# Patient Record
Sex: Female | Born: 1960 | Race: White | Hispanic: No | Marital: Married | Smoking: Never smoker
Health system: Southern US, Community
[De-identification: ages and names within clinical notes are randomized; demographics above are authoritative.]

## PROBLEM LIST (undated history)

## (undated) DIAGNOSIS — Z91018 Allergy to other foods: Secondary | ICD-10-CM

## (undated) DIAGNOSIS — E079 Disorder of thyroid, unspecified: Secondary | ICD-10-CM

## (undated) DIAGNOSIS — F32A Depression, unspecified: Secondary | ICD-10-CM

## (undated) DIAGNOSIS — E039 Hypothyroidism, unspecified: Secondary | ICD-10-CM

## (undated) DIAGNOSIS — R112 Nausea with vomiting, unspecified: Secondary | ICD-10-CM

## (undated) DIAGNOSIS — J45909 Unspecified asthma, uncomplicated: Secondary | ICD-10-CM

## (undated) DIAGNOSIS — Z9889 Other specified postprocedural states: Secondary | ICD-10-CM

## (undated) DIAGNOSIS — F988 Other specified behavioral and emotional disorders with onset usually occurring in childhood and adolescence: Secondary | ICD-10-CM

## (undated) DIAGNOSIS — K8020 Calculus of gallbladder without cholecystitis without obstruction: Secondary | ICD-10-CM

## (undated) DIAGNOSIS — R5382 Chronic fatigue, unspecified: Secondary | ICD-10-CM

## (undated) DIAGNOSIS — F329 Major depressive disorder, single episode, unspecified: Secondary | ICD-10-CM

## (undated) DIAGNOSIS — C50919 Malignant neoplasm of unspecified site of unspecified female breast: Secondary | ICD-10-CM

## (undated) DIAGNOSIS — M1990 Unspecified osteoarthritis, unspecified site: Secondary | ICD-10-CM

## (undated) HISTORY — DX: Calculus of gallbladder without cholecystitis without obstruction: K80.20

## (undated) HISTORY — DX: Chronic fatigue, unspecified: R53.82

## (undated) HISTORY — DX: Malignant neoplasm of unspecified site of unspecified female breast: C50.919

## (undated) HISTORY — DX: Unspecified osteoarthritis, unspecified site: M19.90

## (undated) HISTORY — PX: REFRACTIVE SURGERY: SHX103

## (undated) HISTORY — PX: BREAST SURGERY: SHX581

## (undated) HISTORY — DX: Disorder of thyroid, unspecified: E07.9

## (undated) HISTORY — PX: EYE SURGERY: SHX253

---

## 2003-02-19 ENCOUNTER — Other Ambulatory Visit: Admission: RE | Admit: 2003-02-19 | Discharge: 2003-02-19 | Payer: Self-pay | Admitting: Obstetrics and Gynecology

## 2003-04-09 ENCOUNTER — Encounter: Admission: RE | Admit: 2003-04-09 | Discharge: 2003-04-09 | Payer: Self-pay | Admitting: Obstetrics and Gynecology

## 2004-02-20 ENCOUNTER — Other Ambulatory Visit: Admission: RE | Admit: 2004-02-20 | Discharge: 2004-02-20 | Payer: Self-pay | Admitting: Obstetrics and Gynecology

## 2004-04-23 ENCOUNTER — Encounter: Admission: RE | Admit: 2004-04-23 | Discharge: 2004-04-23 | Payer: Self-pay | Admitting: Obstetrics and Gynecology

## 2005-04-24 ENCOUNTER — Encounter: Admission: RE | Admit: 2005-04-24 | Discharge: 2005-04-24 | Payer: Self-pay | Admitting: Obstetrics and Gynecology

## 2005-05-15 ENCOUNTER — Encounter: Admission: RE | Admit: 2005-05-15 | Discharge: 2005-05-15 | Payer: Self-pay | Admitting: Family Medicine

## 2006-08-25 ENCOUNTER — Encounter: Admission: RE | Admit: 2006-08-25 | Discharge: 2006-08-25 | Payer: Self-pay | Admitting: Obstetrics and Gynecology

## 2007-03-23 ENCOUNTER — Encounter: Admission: RE | Admit: 2007-03-23 | Discharge: 2007-03-23 | Payer: Self-pay | Admitting: Obstetrics and Gynecology

## 2008-09-10 ENCOUNTER — Encounter: Admission: RE | Admit: 2008-09-10 | Discharge: 2008-09-10 | Payer: Self-pay | Admitting: Obstetrics and Gynecology

## 2010-06-26 ENCOUNTER — Other Ambulatory Visit: Payer: Self-pay | Admitting: Obstetrics and Gynecology

## 2010-06-26 DIAGNOSIS — R2230 Localized swelling, mass and lump, unspecified upper limb: Secondary | ICD-10-CM

## 2010-07-02 ENCOUNTER — Ambulatory Visit
Admission: RE | Admit: 2010-07-02 | Discharge: 2010-07-02 | Disposition: A | Discharge status: Home / Self Care | Payer: Managed Care, Other (non HMO) | Source: Ambulatory Visit | Attending: Obstetrics and Gynecology | Admitting: Obstetrics and Gynecology

## 2010-07-02 ENCOUNTER — Other Ambulatory Visit: Payer: Self-pay | Admitting: Obstetrics and Gynecology

## 2010-07-02 DIAGNOSIS — R2230 Localized swelling, mass and lump, unspecified upper limb: Secondary | ICD-10-CM

## 2010-12-02 ENCOUNTER — Other Ambulatory Visit: Payer: Self-pay | Admitting: Obstetrics and Gynecology

## 2010-12-02 DIAGNOSIS — N63 Unspecified lump in unspecified breast: Secondary | ICD-10-CM

## 2010-12-05 ENCOUNTER — Other Ambulatory Visit: Payer: Self-pay | Admitting: Obstetrics and Gynecology

## 2010-12-05 ENCOUNTER — Ambulatory Visit
Admission: RE | Admit: 2010-12-05 | Discharge: 2010-12-05 | Disposition: A | Discharge status: Home / Self Care | Payer: Commercial Indemnity | Source: Ambulatory Visit | Attending: Obstetrics and Gynecology | Admitting: Obstetrics and Gynecology

## 2010-12-05 DIAGNOSIS — N63 Unspecified lump in unspecified breast: Secondary | ICD-10-CM

## 2011-06-29 ENCOUNTER — Ambulatory Visit (INDEPENDENT_AMBULATORY_CARE_PROVIDER_SITE_OTHER): Payer: Commercial Indemnity | Admitting: Obstetrics and Gynecology

## 2011-06-29 ENCOUNTER — Other Ambulatory Visit: Payer: Self-pay | Admitting: Obstetrics and Gynecology

## 2011-06-29 DIAGNOSIS — N644 Mastodynia: Secondary | ICD-10-CM

## 2011-06-29 DIAGNOSIS — Z3040 Encounter for surveillance of contraceptives, unspecified: Secondary | ICD-10-CM

## 2011-06-29 DIAGNOSIS — Z01419 Encounter for gynecological examination (general) (routine) without abnormal findings: Secondary | ICD-10-CM

## 2011-07-06 ENCOUNTER — Ambulatory Visit
Admission: RE | Admit: 2011-07-06 | Discharge: 2011-07-06 | Disposition: A | Discharge status: Home / Self Care | Payer: Commercial Indemnity | Source: Ambulatory Visit | Attending: Obstetrics and Gynecology | Admitting: Obstetrics and Gynecology

## 2011-07-06 DIAGNOSIS — N644 Mastodynia: Secondary | ICD-10-CM

## 2012-06-30 ENCOUNTER — Ambulatory Visit: Payer: Commercial Indemnity | Admitting: Obstetrics and Gynecology

## 2013-06-19 ENCOUNTER — Other Ambulatory Visit: Payer: Self-pay

## 2013-06-19 DIAGNOSIS — Z1231 Encounter for screening mammogram for malignant neoplasm of breast: Secondary | ICD-10-CM

## 2013-07-12 ENCOUNTER — Ambulatory Visit
Admission: RE | Admit: 2013-07-12 | Discharge: 2013-07-12 | Disposition: A | Discharge status: Home / Self Care | Payer: Commercial Indemnity | Source: Ambulatory Visit

## 2013-07-12 DIAGNOSIS — Z1231 Encounter for screening mammogram for malignant neoplasm of breast: Secondary | ICD-10-CM

## 2013-07-13 ENCOUNTER — Other Ambulatory Visit: Payer: Self-pay | Admitting: Obstetrics and Gynecology

## 2013-07-13 DIAGNOSIS — R928 Other abnormal and inconclusive findings on diagnostic imaging of breast: Secondary | ICD-10-CM

## 2013-07-20 ENCOUNTER — Other Ambulatory Visit: Payer: Self-pay | Admitting: Obstetrics and Gynecology

## 2013-07-20 ENCOUNTER — Other Ambulatory Visit: Payer: Self-pay

## 2013-07-20 DIAGNOSIS — R928 Other abnormal and inconclusive findings on diagnostic imaging of breast: Secondary | ICD-10-CM

## 2013-07-26 ENCOUNTER — Ambulatory Visit
Admission: RE | Admit: 2013-07-26 | Discharge: 2013-07-26 | Disposition: A | Discharge status: Home / Self Care | Payer: Self-pay | Source: Ambulatory Visit | Attending: Obstetrics and Gynecology | Admitting: Obstetrics and Gynecology

## 2013-07-26 ENCOUNTER — Ambulatory Visit
Admission: RE | Admit: 2013-07-26 | Discharge: 2013-07-26 | Disposition: A | Discharge status: Home / Self Care | Payer: Commercial Indemnity | Source: Ambulatory Visit | Attending: Obstetrics and Gynecology | Admitting: Obstetrics and Gynecology

## 2013-07-26 DIAGNOSIS — R928 Other abnormal and inconclusive findings on diagnostic imaging of breast: Secondary | ICD-10-CM

## 2014-04-20 DIAGNOSIS — C50919 Malignant neoplasm of unspecified site of unspecified female breast: Secondary | ICD-10-CM

## 2014-04-20 HISTORY — DX: Malignant neoplasm of unspecified site of unspecified female breast: C50.919

## 2014-09-12 ENCOUNTER — Other Ambulatory Visit: Payer: Self-pay | Admitting: Obstetrics and Gynecology

## 2014-09-12 DIAGNOSIS — N63 Unspecified lump in unspecified breast: Secondary | ICD-10-CM

## 2014-09-20 ENCOUNTER — Other Ambulatory Visit: Payer: Commercial Indemnity

## 2014-09-24 ENCOUNTER — Other Ambulatory Visit: Payer: Self-pay | Admitting: Obstetrics and Gynecology

## 2014-09-24 ENCOUNTER — Ambulatory Visit
Admission: RE | Admit: 2014-09-24 | Discharge: 2014-09-24 | Disposition: A | Discharge status: Home / Self Care | Payer: Managed Care, Other (non HMO) | Source: Ambulatory Visit | Attending: Obstetrics and Gynecology | Admitting: Obstetrics and Gynecology

## 2014-09-24 ENCOUNTER — Other Ambulatory Visit: Payer: Commercial Indemnity

## 2014-09-24 DIAGNOSIS — N63 Unspecified lump in unspecified breast: Secondary | ICD-10-CM

## 2014-09-27 ENCOUNTER — Other Ambulatory Visit: Payer: Self-pay | Admitting: Obstetrics and Gynecology

## 2014-09-27 DIAGNOSIS — N63 Unspecified lump in unspecified breast: Secondary | ICD-10-CM

## 2014-09-28 ENCOUNTER — Other Ambulatory Visit: Payer: Self-pay | Admitting: Obstetrics and Gynecology

## 2014-09-28 ENCOUNTER — Ambulatory Visit
Admission: RE | Admit: 2014-09-28 | Discharge: 2014-09-28 | Disposition: A | Discharge status: Home / Self Care | Payer: Managed Care, Other (non HMO) | Source: Ambulatory Visit | Attending: Obstetrics and Gynecology | Admitting: Obstetrics and Gynecology

## 2014-09-28 DIAGNOSIS — N63 Unspecified lump in unspecified breast: Secondary | ICD-10-CM

## 2014-10-03 ENCOUNTER — Telehealth: Payer: Self-pay | Admitting: *Deleted

## 2014-10-03 NOTE — Telephone Encounter (Signed)
Confirmed [facility] for 10/10/14 at 1230.  Instructions and contact information given.

## 2014-10-09 ENCOUNTER — Other Ambulatory Visit: Payer: Self-pay | Admitting: *Deleted

## 2014-10-09 DIAGNOSIS — C50919 Malignant neoplasm of unspecified site of unspecified female breast: Secondary | ICD-10-CM

## 2014-10-09 DIAGNOSIS — C50411 Malignant neoplasm of upper-outer quadrant of right female breast: Secondary | ICD-10-CM | POA: Insufficient documentation

## 2014-10-10 ENCOUNTER — Encounter: Payer: Self-pay | Admitting: Hematology and Oncology

## 2014-10-10 ENCOUNTER — Ambulatory Visit: Payer: Self-pay | Admitting: Surgery

## 2014-10-10 ENCOUNTER — Other Ambulatory Visit (HOSPITAL_BASED_OUTPATIENT_CLINIC_OR_DEPARTMENT_OTHER): Payer: Managed Care, Other (non HMO)

## 2014-10-10 ENCOUNTER — Ambulatory Visit: Payer: Managed Care, Other (non HMO)

## 2014-10-10 ENCOUNTER — Encounter: Payer: Self-pay | Admitting: Physical Therapy

## 2014-10-10 ENCOUNTER — Ambulatory Visit
Admission: RE | Admit: 2014-10-10 | Discharge: 2014-10-10 | Disposition: A | Discharge status: Home / Self Care | Payer: Managed Care, Other (non HMO) | Source: Ambulatory Visit | Attending: Radiation Oncology | Admitting: Radiation Oncology

## 2014-10-10 ENCOUNTER — Ambulatory Visit: Payer: Managed Care, Other (non HMO) | Attending: Surgery | Admitting: Physical Therapy

## 2014-10-10 ENCOUNTER — Ambulatory Visit (HOSPITAL_BASED_OUTPATIENT_CLINIC_OR_DEPARTMENT_OTHER): Payer: Managed Care, Other (non HMO) | Admitting: Hematology and Oncology

## 2014-10-10 VITALS — BP 117/64 | HR 69 | Temp 98.2°F | Resp 18 | Ht 66.0 in | Wt 127.4 lb

## 2014-10-10 DIAGNOSIS — C50411 Malignant neoplasm of upper-outer quadrant of right female breast: Secondary | ICD-10-CM

## 2014-10-10 DIAGNOSIS — Z803 Family history of malignant neoplasm of breast: Secondary | ICD-10-CM

## 2014-10-10 DIAGNOSIS — C50919 Malignant neoplasm of unspecified site of unspecified female breast: Secondary | ICD-10-CM

## 2014-10-10 LAB — CBC WITH DIFFERENTIAL/PLATELET
BASO%: 0.4 % (ref 0.0–2.0)
Basophils Absolute: 0 10*3/uL (ref 0.0–0.1)
EOS%: 1.3 % (ref 0.0–7.0)
Eosinophils Absolute: 0.1 10*3/uL (ref 0.0–0.5)
HEMATOCRIT: 40.2 % (ref 34.8–46.6)
HGB: 13.5 g/dL (ref 11.6–15.9)
LYMPH#: 1.9 10*3/uL (ref 0.9–3.3)
LYMPH%: 25.4 % (ref 14.0–49.7)
MCH: 28.6 pg (ref 25.1–34.0)
MCHC: 33.7 g/dL (ref 31.5–36.0)
MCV: 84.9 fL (ref 79.5–101.0)
MONO#: 0.6 10*3/uL (ref 0.1–0.9)
MONO%: 7.8 % (ref 0.0–14.0)
NEUT#: 4.7 10*3/uL (ref 1.5–6.5)
NEUT%: 65.1 % (ref 38.4–76.8)
Platelets: 213 10*3/uL (ref 145–400)
RBC: 4.73 10*6/uL (ref 3.70–5.45)
RDW: 13.2 % (ref 11.2–14.5)
WBC: 7.3 10*3/uL (ref 3.9–10.3)

## 2014-10-10 LAB — COMPREHENSIVE METABOLIC PANEL (CC13)
ALBUMIN: 4.5 g/dL (ref 3.5–5.0)
ALT: 9 U/L (ref 0–55)
ANION GAP: 8 meq/L (ref 3–11)
AST: 18 U/L (ref 5–34)
Alkaline Phosphatase: 71 U/L (ref 40–150)
BUN: 14.7 mg/dL (ref 7.0–26.0)
CALCIUM: 10.1 mg/dL (ref 8.4–10.4)
CHLORIDE: 104 meq/L (ref 98–109)
CO2: 31 meq/L — AB (ref 22–29)
Creatinine: 1 mg/dL (ref 0.6–1.1)
EGFR: 65 mL/min/{1.73_m2} — ABNORMAL LOW (ref >90)
Glucose: 107 mg/dl (ref 70–140)
POTASSIUM: 4 meq/L (ref 3.5–5.1)
Sodium: 143 mEq/L (ref 136–145)
Total Bilirubin: 0.43 mg/dL (ref 0.20–1.20)
Total Protein: 7 g/dL (ref 6.4–8.3)

## 2014-10-10 NOTE — Progress Notes (Signed)
[name] is a very pleasant 54 y.o. 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  Biopsy results revealed the tumor's prognostic profile is ER positive, PR positive, and HER2/neu negative. Ki67 is 5%.  She presents today with her husband to the [facility]) for treatment consideration and recommendations from the breast surgeon, radiation oncologist, and medical oncologist.     I briefly met with [name] and her husband during her [facility] visit today. We discussed the purpose of the Survivorship Clinic, which will include monitoring for recurrence, coordinating completion of age and gender-appropriate cancer screenings, promotion of overall wellness, as well as managing potential late/long-term side effects of anti-cancer treatments.    The treatment plan for [name] will likely include surgery, potential radiation therapy depending on her surgery choice, and anti-estrogen therapy.  As of today, the intent of treatment for [name] is cure, therefore she will be eligible for the Survivorship Clinic upon her completion of treatment.  Her survivorship care plan (SCP) document will be drafted and updated throughout the course of her treatment trajectory. She will receive the SCP in an office visit with myself in the Survivorship Clinic once she has completed treatment.   [name] was encouraged to ask questions and all questions were answered to her satisfaction.  She was given my business card and encouraged to contact me with any concerns regarding survivorship.  I look forward to participating in her care.   Mike Craze, NP [place] [phone]

## 2014-10-10 NOTE — Patient Instructions (Signed)

## 2014-10-10 NOTE — Progress Notes (Signed)
Checked in new pt with no financial concerns prior to seeing the dr. Informed her if chemo is part of her treatment we will call her ins to see if Josem Kaufmann is required and will obtain it if it is. She has my card for any billing questions, concerns or if financial assistance is needed.

## 2014-10-10 NOTE — Therapy (Signed)
[place], [place], [address] Phone: [phone]   Fax:  [phone]  Physical Therapy Evaluation  Patient Details  Name: Tracey Mckinney MRN: [id number] Date of Birth: 1960/08/29 Referring Provider:  Erroll Luna, MD  Encounter Date: 10/10/2014      PT End of Session - 10/10/14 2144    Visit Number 1   Number of Visits 1   PT Start Time 9833   PT Stop Time 1425  And from 1530-1550 (30 minutes total)   PT Time Calculation (min) 10 min   Activity Tolerance Patient tolerated treatment well   Behavior During Therapy [facility] for tasks assessed/performed      Past Medical History  Diagnosis Date  . Chronic fatigue   . Thyroid disease   . Gallstones   . Arthritis     Past Surgical History  Procedure Laterality Date  . Refractive surgery      There were no vitals filed for this visit.  Visit Diagnosis:  Carcinoma of upper-outer quadrant of right female breast - Plan: PT plan of care cert/re-cert      Subjective Assessment - 10/10/14 2145    Pertinent History Patient was diagnosed 10/03/14 with right ER/PR positive, HER2 negative invasive ductal carcinoma with DCIS.  It has a ki67 of 5% and measures 1.8 cm in the right upper outer quadrant.            [facility] PT Assessment - 10/10/14 0001    Assessment   Medical Diagnosis Right breast cancer   Onset Date/Surgical Date 10/03/14   Hand Dominance Right   Precautions   Precautions Other (comment)  Active breast cancer   Restrictions   Weight Bearing Restrictions No   Balance Screen   Has the patient fallen in the past 6 months No   Has the patient had a decrease in activity level because of a fear of falling?  No   Is the patient reluctant to leave their home because of a fear of falling?  No   Home Environment   Living Environment Private residence   Living Arrangements Spouse/significant other;Children  Husband and 70 y.o. 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   Leisure She walks 2x/week for 30-45 minutes   Cognition   Overall Cognitive Status Within Functional Limits for tasks assessed   Posture/Postural Control   Posture/Postural Control No significant limitations   ROM / Strength   AROM / PROM / Strength AROM;Strength   AROM   AROM Assessment Site Shoulder   Right/Left Shoulder Right;Left   Right Shoulder Extension 45 Degrees   Right Shoulder Flexion 140 Degrees   Right Shoulder ABduction 146 Degrees   Right Shoulder Internal Rotation 65 Degrees   Right Shoulder External Rotation 72 Degrees   Left Shoulder Extension 50 Degrees   Left Shoulder Flexion 144 Degrees   Left Shoulder ABduction 168 Degrees   Left Shoulder Internal Rotation 70 Degrees   Left Shoulder External Rotation 85 Degrees   Strength   Overall Strength Within functional limits for tasks performed           LYMPHEDEMA/ONCOLOGY QUESTIONNAIRE - 10/10/14 2142    Type   Cancer Type Right breast cancer   Lymphedema Assessments   Lymphedema Assessments Upper extremities   Right Upper Extremity Lymphedema   10 cm  Proximal to Olecranon Process 24.2 cm   Olecranon Process 22.1 cm   10 cm Proximal to Ulnar Styloid Process 18.7 cm   Just Proximal to Ulnar Styloid Process 14.2 cm   Across Hand at [employer] 17.9 cm   At [place] of 2nd Digit 5.3 cm   Left Upper Extremity Lymphedema   10 cm Proximal to Olecranon Process 23.5 cm   Olecranon Process 22.1 cm   10 cm Proximal to Ulnar Styloid Process 16.6 cm   Just Proximal to Ulnar Styloid Process 14 cm   Across Hand at [employer] 17.1 cm   At [place] of 2nd Digit 5.3 cm        Patient was instructed today in a home exercise program today for post op shoulder range of motion. These included active assist shoulder flexion  in sitting, scapular retraction, wall walking with shoulder abduction, and hands behind head external rotation.  She was encouraged to do these twice a day, holding 3 seconds and repeating 5 times when permitted by her physician.         PT Education - 10/10/14 2143    Education provided Yes   Education Details Post op shoulder ROM HEP and lymphedema risk reduction   Person(s) Educated Patient   Methods Explanation;Demonstration;Handout   Comprehension Returned demonstration;Verbalized understanding              Breast Clinic Goals - 10/10/14 2147    Patient will be able to verbalize understanding of pertinent lymphedema risk reduction practices relevant to her diagnosis specifically related to skin care.   Time 1   Period Days   Status Achieved   Patient will be able to return demonstrate and/or verbalize understanding of the post-op home exercise program related to regaining shoulder range of motion.   Time 1   Period Days   Status Achieved   Patient will be able to verbalize understanding of the importance of attending the postoperative After Breast Cancer Class for further lymphedema risk reduction education and therapeutic exercise.   Time 1   Period Days   Status Achieved              Plan - 10/10/14 2145    Clinical Impression Statement Patient was diagnosed 10/03/14 with right ER/PR positive, HER2 negative invasive ductal carcinoma with DCIS.  It has a ki67 of 5% and measures 1.8 cm in the right upper outer quadrant.  She is planning to havve either a right lumpectomy or mastectomy with a sentinel node biopsy followed by radiation if she has a lumpectomy.  She will undergo anti-estrogen therapy.  She will likely benefit from post op PT to regain shoulder ROM and strength and reduce lymphedema risk.   Pt will benefit from skilled therapeutic intervention in order to improve on the following deficits Decreased range of motion;Impaired UE functional  use;Pain;Decreased strength;Decreased knowledge of precautions   Rehab Potential Excellent   Clinical Impairments Affecting Rehab Potential none   PT Frequency One time visit   PT Treatment/Interventions Patient/family education;Therapeutic exercise   Consulted and Agree with Plan of Care Patient;Family member/caregiver   Family Member Consulted Husband     Patient will follow up at outpatient cancer rehab if needed following surgery.  If the patient requires physical therapy at that time, a specific plan will be dictated and sent to the referring physician for approval. The patient was educated today on appropriate basic range of motion exercises to begin post operatively and the importance  of attending the After Breast Cancer class following surgery.  Patient was educated today on lymphedema risk reduction practices as it pertains to recommendations that will benefit the patient immediately following surgery.  She verbalized good understanding.  No additional physical therapy is indicated at this time.       Problem List Patient Active Problem List   Diagnosis Date Noted  . Breast cancer of upper-outer quadrant of right female breast 10/09/2014    Annia Friendly, PT 10/10/2014, 9:49 PM  [place], [place], [address] Phone: [phone]   Fax:  [phone]

## 2014-10-10 NOTE — Progress Notes (Signed)
[place] NOTE  Patient Care Team: Arnetha Gula, MD as PCP - General (Family Medicine) Erroll Luna, MD as Consulting Physician (General Surgery) Nicholas Lose, MD as Consulting Physician (Hematology and Oncology) Gery Pray, MD as Consulting Physician (Radiation Oncology) Mauro Kaufmann, RN as Registered Nurse Rockwell Germany, RN as Registered Nurse  CHIEF COMPLAINTS/PURPOSE OF CONSULTATION:  Newly diagnosed right breast cancer  HISTORY OF PRESENTING ILLNESS:  Tracey Mckinney 54 y.o. female is here because of recent diagnosis of right breast cancer. Patient had long-standing problems with both breasts having cysts. She had cyst aspiration 2010 9 2012. Most recent mammogram in June 2016 revealed increased right breast density at 10:00 by ultrasound measured 1.8 cm in addition small cluster of additional nodules were also seen. Biopsy of the 10:00 mass revealed grade 1 invasive ductal carcinoma with DCIS that was ER/PR positive HER-2 negative with a Ki-67 of 5%, biopsy of the additional small nodule revealed benign fibrocystic changes. She was present this morning at the multidisciplinary tumor board and she is here today to discuss the treatment plan.   I reviewed her records extensively and collaborated the history with the patient.  SUMMARY OF ONCOLOGIC HISTORY:   Breast cancer of upper-outer quadrant of right female breast   09/28/2014 Initial Diagnosis Right breast needle biopsy 10:00 position: 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%, PR 50%, Ki-67 5%, HER-2 negative, grade 1; right breast biopsy 10:00 2 cm from nipple: Fibrocystic changes with usual ductal hyperplasia   09/28/2014 Mammogram Mammogram and ultrasound suspicious irregular mass at 10:00 position right breast 1.8 cm in addition 3 small nodules 0.5, 0.6, 0.5 cm biopsy proven to be benign fibrocystic changes    In terms of breast cancer risk profile:  She menarched at early age of 64 and went to  menopause at age 25  She had 2 pregnancy, her first child was born at age 33  She has received birth control pills for approximately 2 years, progesterone approximately 6 years testosterone for a year in 2014.  She was never exposed to fertility medications or hormone replacement therapy.  She has  family history of Breast/GYN/GI cancer Mother age 2 breast cancer, maternal grandfather had unknown cancer  MEDICAL HISTORY:  Past Medical History  Diagnosis Date  . Chronic fatigue   . Thyroid disease   . Gallstones   . Arthritis     SURGICAL HISTORY: Past Surgical History  Procedure Laterality Date  . Refractive surgery      SOCIAL HISTORY: History   Social History  . Marital Status: Married    Spouse Name: N/A  . Number of Children: N/A  . Years of Education: N/A   Occupational History  . Not on file.   Social History Main Topics  . Smoking status: Never Smoker   . Smokeless tobacco: Not on file  . Alcohol Use: No  . Drug Use: No  . Sexual Activity: Not on file   Other Topics Concern  . Not on file   Social History Narrative  . No narrative on file    FAMILY HISTORY: Family History  Problem Relation Age of Onset  . Breast cancer Mother     ALLERGIES:  has No Known Allergies.  MEDICATIONS:  Current Outpatient Prescriptions  Medication Sig Dispense Refill  . Cholecalciferol (VITAMIN D3) 5000 UNITS CAPS Take by mouth.    Marland Kitchen glucosamine-chondroitin 500-400 MG tablet Take 1 tablet by mouth.    . levalbuterol (XOPENEX HFA) 45  MCG/ACT inhaler Inhale into the lungs every 4 (four) hours as needed for wheezing.    . metroNIDAZOLE (METROCREAM) 0.75 % cream Apply topically 2 (two) times daily.    . Probiotic Product (ACIDOPHILUS/GOAT MILK) CAPS Take by mouth.    Marland Kitchen UNABLE TO FIND Thyroid 1/4 grain     No current facility-administered medications for this visit.    REVIEW OF SYSTEMS:   Constitutional: Denies fevers, chills or abnormal night sweats Eyes: Denies  blurriness of vision, double vision or watery eyes Ears, nose, mouth, throat, and face: Denies mucositis or sore throat Respiratory: Denies cough, dyspnea or wheezes Cardiovascular: Denies palpitation, chest discomfort or lower extremity swelling Gastrointestinal:  Denies nausea, heartburn or change in bowel habits Skin: Denies abnormal skin rashes Lymphatics: Denies new lymphadenopathy or easy bruising Neurological:Denies numbness, tingling or new weaknesses Behavioral/Psych: Mood is stable, no new changes  Breast:  Denies any palpable lumps or discharge All other systems were reviewed with the patient and are negative.  PHYSICAL EXAMINATION: ECOG PERFORMANCE STATUS: 1 - Symptomatic but completely ambulatory  Filed Vitals:   10/10/14 1308  BP: 117/64  Pulse: 69  Temp: 98.2 F (36.8 C)  Resp: 18   Filed Weights   10/10/14 1308  Weight: 127 lb 6.4 oz (57.788 kg)    GENERAL:alert, no distress and comfortable SKIN: skin color, texture, turgor are normal, no rashes or significant lesions EYES: normal, conjunctiva are pink and non-injected, sclera clear OROPHARYNX:no exudate, no erythema and lips, buccal mucosa, and tongue normal  NECK: supple, thyroid normal size, non-tender, without nodularity LYMPH:  no palpable lymphadenopathy in the cervical, axillary or inguinal LUNGS: clear to auscultation and percussion with normal breathing effort HEART: regular rate & rhythm and no murmurs and no lower extremity edema ABDOMEN:abdomen soft, non-tender and normal bowel sounds Musculoskeletal:no cyanosis of digits and no clubbing  PSYCH: alert & oriented x 3 with fluent speech NEURO: no focal motor/sensory deficits BREAST: No palpable nodules in breast. No palpable axillary or supraclavicular lymphadenopathy (exam performed in the presence of a chaperone)   LABORATORY DATA:  I have reviewed the data as listed Lab Results  Component Value Date   WBC 7.3 10/10/2014   HGB 13.5 10/10/2014    HCT 40.2 10/10/2014   MCV 84.9 10/10/2014   PLT 213 10/10/2014   Lab Results  Component Value Date   NA 143 10/10/2014   K 4.0 10/10/2014   CO2 31* 10/10/2014    RADIOGRAPHIC STUDIES: I have personally reviewed the radiological reports and agreed with the findings in the report. Results are summarized as above  ASSESSMENT AND PLAN:  Breast cancer of upper-outer quadrant of right female breast Right breast biopsy 09/28/2014: grade 1 invasive ductal carcinoma with DCIS plus a large at 10:00 position 1.8 cm by ultrasound ER/PR positive HER-2 negative Ki-67 5% T1 cN0 M0 stage IA clinical stage; small cluster for additional nodules at 10:00 biopsy-proven benign;  Pathology and radiology counseling:Discussed with the patient, the details of pathology including the type of breast cancer,the clinical staging, the significance of ER, PR and HER-2/neu receptors and the implications for treatment. After reviewing the pathology in detail, we proceeded to discuss the different treatment options between surgery, radiation, chemotherapy, antiestrogen therapies.  Recommendations: 1. Surgery (patient is debating regarding what type of surgery she would like given her bilateral breast cysts. She might choose bilateral nipple sparing mastectomies, she will think about it and decide) 2. Oncotype DX testing to determine if chemotherapy would be of any  benefit followed by 3. Adjuvant radiation therapy followed by 4. Adjuvant antiestrogen therapy  Oncotype counseling: I discussed Oncotype DX test. I explained to the patient that this is a 21 gene panel to evaluate patient tumors DNA to calculate recurrence score. This would help determine whether patient has high risk or intermediate risk or low risk breast cancer. She understands that if her tumor was found to be high risk, she would benefit from systemic chemotherapy. If low risk, no need of chemotherapy. If she was found to be intermediate risk, we would  need to evaluate the score as well as other risk factors and determine if an abbreviated chemotherapy may be of benefit.  Return to clinic after surgery to discuss final pathology report and then determine if Oncotype DX testing will need to be sent.   All questions were answered. The patient knows to call the clinic with any problems, questions or concerns.    Rulon Eisenmenger, MD 3:43 PM

## 2014-10-10 NOTE — Assessment & Plan Note (Signed)
Right breast biopsy 09/28/2014: grade 1 invasive ductal carcinoma with DCIS plus a large at 10:00 position 1.8 cm by ultrasound ER/PR positive HER-2 negative Ki-67 5% T1 cN0 M0 stage IA clinical stage; small cluster for additional nodules at 10:00 biopsy-proven benign;  Pathology and radiology counseling:Discussed with the patient, the details of pathology including the type of breast cancer,the clinical staging, the significance of ER, PR and HER-2/neu receptors and the implications for treatment. After reviewing the pathology in detail, we proceeded to discuss the different treatment options between surgery, radiation, chemotherapy, antiestrogen therapies.  Recommendations: 1. Surgery (patient is debating regarding what type of surgery she would like given her bilateral breast cysts. She might choose bilateral nipple sparing mastectomies, she will think about it and decide) 2. Oncotype DX testing to determine if chemotherapy would be of any benefit followed by 3. Adjuvant radiation therapy followed by 4. Adjuvant antiestrogen therapy  Oncotype counseling: I discussed Oncotype DX test. I explained to the patient that this is a 21 gene panel to evaluate patient tumors DNA to calculate recurrence score. This would help determine whether patient has high risk or intermediate risk or low risk breast cancer. She understands that if her tumor was found to be high risk, she would benefit from systemic chemotherapy. If low risk, no need of chemotherapy. If she was found to be intermediate risk, we would need to evaluate the score as well as other risk factors and determine if an abbreviated chemotherapy may be of benefit.  Return to clinic after surgery to discuss final pathology report and then determine if Oncotype DX testing will need to be sent.

## 2014-10-10 NOTE — H&P (Signed)
Tracey Mckinney 10/10/2014 8:09 AM Location: [facility] Patient #: [id number] DOB: [birth year] Undefined / Language: Tracey Mckinney / Race: Undefined Female History of Present Illness Tracey Moores A. Stewart Sasaki MD; 10/10/2014 3:14 PM) Patient words: CLINICAL DATA: Palpable abnormalities bilaterally. History of cysts and aspirations.  EXAM: DIGITAL DIAGNOSTIC BILATERAL MAMMOGRAM WITH 3D TOMOSYNTHESIS WITH CAD  ULTRASOUND BILATERAL BREAST  COMPARISON: 07/12/2013 and earlier  ACR Breast Density Category c: The breast tissue is heterogeneously dense, which may obscure small masses.  FINDINGS: Within the upper-outer quadrant of the right breast there is distortion correlating with the area of patient's concern, best seen on the craniocaudal view. Within the central portion of the left breast there is a small rounded partially obscured density further evaluated with ultrasound.  Mammographic images were processed with CAD.  On physical exam, I palpate a focal area of firm thickening in the 10 o'clock location of the right breast, extending radially towards the nipple. I palpate no right axillary adenopathy. I palpate no discrete abnormality in the periareolar region of the left breast.  Targeted ultrasound is performed, showing an irregular hypoechoic mass in the 10 o'clock location of the right breast 3 cm from the nipple. This measures 1.8 x 1.0 x 1.6 cm and is associated with internal blood flow on Doppler evaluation. Biopsy is recommended. Approximately 2.2 cm medial to this nodule there are 3 small hypoechoic nodules measuring 0.5, 0.6, and 0.5 cm. These contain internal echoes. Two are associated with increased acoustic transmission. One is associated with slightly more regular walls and possible acoustic shadowing. I would recommend biopsy of the more irregular nodule. Evaluation of the right axilla is negative for adenopathy.  Ultrasound evaluation of the periareolar region  of the left breast demonstrates numerous hypoechoic and anechoic lesions, consistent with cysts and fibrocystic changes. No suspicious nodules are identified on the left.  IMPRESSION: 1. Suspicious irregular mass in the 10 o'clock location of the right breast warrants tissue diagnosis. 2. Numerous small nodules bilaterally. Small irregular nodule in the 10 o'clock location of the right breast 1 cm from the nipple is slightly suspicious and biopsy is recommended.  RECOMMENDATION: 1. Ultrasound-guided core biopsy mass in the 10 o'clock location of the right breast 3 cm from nipple. 2. Ultrasound-guided core biopsy of nodule in the 10 o'clock location of the right breast 1 cm from the nipple.  I have discussed the findings and recommendations with the patient. Results were also provided in writing at the conclusion of the visit. If applicable, a reminder letter will be sent to the patient regarding the next appointment.  BI-RADS CATEGORY 4: Suspicious            Patient: Tracey Mckinney Collected: 09/28/2014 Client: [facility] Accession: [id number] Received: 09/28/2014 Tracey Nations, MD DOB: 10/04/1960 Age: 18 Gender: F Reported: 10/01/2014 Tracey Mckinney Patient Ph: [phone] MRN #: [id number] Tracey Mckinney, Tracey Mckinney [id number] Client Acc#: Chart #: [id number] Phone: [phone] Fax: CC: [facility] CC Tracey Mckinney CC: Tracey Mckinney & Tracey Gula, MD; PLEASE CALL REPORT TO Tracey Mckinney [phone] OR [phone] EXT [phone] REPORT OF SURGICAL PATHOLOGY ADDITIONAL INFORMATION: 1. PROGNOSTIC INDICATORS - ACIS Results: IMMUNOHISTOCHEMICAL AND MORPHOMETRIC ANALYSIS BY THE AUTOMATED CELLULAR IMAGING SYSTEM (ACIS) Estrogen Receptor: 95%, POSITIVE, STRONG STAINING INTENSITY (PERFORMED MANUALLY) Progesterone Receptor: 50%, POSITIVE, STRONG STAINING INTENSITY (PERFORMED MANUALLY) Proliferation Marker Ki67: 5% (PERFORMED MANUALLY) REFERENCE RANGE ESTROGEN  RECEPTOR NEGATIVE <1% POSITIVE =>1% PROGESTERONE RECEPTOR NEGATIVE <1% POSITIVE =>1% All controls stained appropriately Tracey Cutter MD  Pathologist, Electronic Signature ( Signed 10/02/2014) FINAL DIAGNOSIS Diagnosis 1. Breast, right, needle core biopsy, 10 o'clock, 5 cm/n - INVASIVE DUCTAL CARCINOMA. - DUCTAL CARCINOMA IN SITU 1 of 3 FINAL for Tracey Mckinney, Tracey Mckinney [phone]) Diagnosis(continued) - LOBULAR NEOPLASIA (ATYPICAL LOBULAR HYPERPLASIA). - SEE COMMENT. 2. Breast, right, needle core biopsy, 10 o'clock, 2 cm/n - FIBROCYSTIC CHANGES WITH USUAL DUCTAL HYPERPLASIA. - THERE IS NO EVIDENCE OF MALIGNANCY. Microscopic Comment 1. The invasive carcinoma appears grade 1. A breast prognostic profile will be performed and the results reported separately. The results were called to [facility] on 10/01/14. (JBK:gt, 10/01/14) Tracey Cutter MD Pathologist, Electronic Signature (Case signed 10/01/2014) Specimen Gross and Clinical Information Specimen Comment 1. In formalin 3:15 extracted < 5 min; susp mass 5 cm/n 2. Indet mass 2 cm/n Specimen(s) Obtained: 1. Breast, right, needle core biopsy, 10 o'clock, 5 cm/n 2. Breast, right, needle core biopsy, 10 o'clock, 2 cm/n Specimen Clinical Information 1. Suspect IMC at 10 o'clock, 5 cm/n 2. Possible CA vs. FCC or FA at 10 o'clock, 2 cm/n Gross 1. Received in formalin are 2 cores of soft, tan-red tissue which measure 1 x 0.2 x 0.2 cm and up to 1.4 x 0.2 x 0.2 cm. Submitted in toto in 1 block(s). Time in Formalin 3:15 pm (sk) 2. Received in formalin are 3 cores of soft, tan-red tissue which measure 0.5 x 0.2 x 0.2 cm and up to 1.5 x 0.2 x 0.2 cm. Submitted in toto in 1 block(s). Time in Formalin 3:15 pm (sk) Stain(s) used in Diagnosis: The following stain(s) were used in diagnosing the case: Her2 FISH, ER-ACIS, PR-ACIS, KI-67-ACIS. The control(s) stained appropriately. Disclaimer HER2 IQFISH pharmDX (code [id number]) is a  direct fluorescence in-situ hybridization assay designed to quantitatively determine HER2 gene amplification in formalin-fixed, paraffin-embedded tissue specimens. It is performed at [facility] and is reported using ASCO/CAP scoring criteria published in 2013. PR progesterone receptor (PgR 636), immunohistochemical stains are performed on formalin fixed, paraffin embedded tissue using a 3,3"-diaminobenzidine (DAB) chromogen and DAKO Autostainer System. The staining intensity of the nucleus is scored morphometrically using the Automated Cellular Imaging System (ACIS) and is reported as the percentage of tumor cell nuclei demonstrating specific nuclear staining. Estrogen receptor (SP1), immunohistochemical stains are performed on formalin fixed, paraffin embedded tissue using a 3,3"-diaminobenzidine (DAB) chromogen and DAKO Autostainer System. The staining intensity of the nucleus is scored morphometrically using the Automated Cellular Imaging System (ACIS) and is reported as the percentage of tumor cell nuclei demonstrating specific nuclear staining. Ki-67 (Mib-1), immunohistochemical stains are performed on formalin fixed, paraffin embedded tissue using a 3,3"-diaminobenzidine (DAB) chromogen and [place]. The staining intensity of the nucleus is scored morphometrically using the Automated Cellular Imaging System (ACIS) and is reported as the percentage of tumor cell nuclei demonstrating specific nuclear staining. 2 of 3 FINAL for [facility] Mckinney, Adessa [phone]) Report signed out from the following location(s) Technical Component was performed at [facility]. [address],[place] [address].[id number]., Technical component and interpretation was performed at [facility], [place], [place] [address]. CLIA #: [id number],       pt sent at the request of Dr Sondra Come for right breast cancer.  The patient  is a 54 year old female who presents with breast cancer. 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 Tumor markers include estrogen receptor positive, progesterone receptor positive  and HER-2/neu negative. No associated conditions are noted. Initial presentation was 4 week(s) ago for an abnormal mammogram. Current diagnosis was determined by mammography and right breast ultrasound. Symptoms include fatigue. Other Problems Anderson Malta [place], [place]; 10/10/2014 8:09 AM) Arthritis Asthma Back Pain Bladder Problems Breast Cancer Cholelithiasis Depression Gastroesophageal Reflux Disease Lump In Breast  Past Surgical History Anderson Malta [place], RMA; 10/10/2014 8:09 AM) Breast Biopsy Right. Oral Surgery  Diagnostic Studies History Anderson Malta [place], [place]; 10/10/2014 8:09 AM) Colonoscopy within last year Mammogram within last year Pap Smear 1-5 years ago  Social History Anderson Malta [place], RMA; 10/10/2014 8:09 AM) No alcohol use No caffeine use No drug use Tobacco use Never smoker.  Family History Anderson Malta [place], [place]; 10/10/2014 8:09 AM) Breast Cancer Mother. Cancer Family Members In General. Depression Mother. Diabetes Mellitus Family Members In General, Mother. Heart Disease Family Members In General. Hypertension Father, Sister. Migraine Headache Daughter. Thyroid problems Father.  Pregnancy / Birth History Anderson Malta [place], [place]; 10/10/2014 8:10 AM) Age at menarche 25 years. Age of menopause 51-55 Contraceptive History Oral contraceptives. Gravida 2 Irregular periods Maternal age 24-30 Para 2     Review of Systems Anderson Malta [place] RMA; 10/10/2014 8:10 AM) General Present- Fatigue, Night Sweats and Weight Loss. Not Present- Appetite Loss, Chills, Fever and Weight Gain. Skin Present- Dryness. Not Present- Change in Wart/Mole, Hives, Jaundice, New Lesions, Non-Healing Wounds, Rash and  Ulcer. HEENT Present- Hoarseness, Seasonal Allergies, Visual Disturbances and Wears glasses/contact lenses. Not Present- Earache, Hearing Loss, Nose Bleed, Oral Ulcers, Ringing in the Ears, Sinus Pain, Sore Throat and Yellow Eyes. Respiratory Not Present- Bloody sputum, Chronic Cough, Difficulty Breathing, Snoring and Wheezing. Breast Present- Breast Mass and Breast Pain. Not Present- Nipple Discharge and Skin Changes. Cardiovascular Not Present- Chest Pain, Difficulty Breathing Lying Down, Leg Cramps, Palpitations, Rapid Heart Rate, Shortness of Breath and Swelling of Extremities. Gastrointestinal Present- Abdominal Pain, Bloating, Change in Bowel Habits, Excessive gas, Gets full quickly at meals and Indigestion. Not Present- Bloody Stool, Chronic diarrhea, Constipation, Difficulty Swallowing, Hemorrhoids, Nausea, Rectal Pain and Vomiting. Female Genitourinary Not Present- Frequency, Nocturia, Painful Urination, Pelvic Pain and Urgency. Musculoskeletal Not Present- Back Pain, Joint Pain, Joint Stiffness, Muscle Pain, Muscle Weakness and Swelling of Extremities. Neurological Present- Decreased Memory. Not Present- Fainting, Headaches, Numbness, Seizures, Tingling, Tremor, Trouble walking and Weakness. Psychiatric Present- Anxiety, Change in Sleep Pattern and Depression. Not Present- Bipolar, Fearful and Frequent crying. Endocrine Present- Excessive Hunger. Not Present- Cold Intolerance, Hair Changes, Heat Intolerance, Hot flashes and New Diabetes. Hematology Present- Easy Bruising. Not Present- Excessive bleeding, Gland problems, HIV and Persistent Infections.   Physical Exam (Shalah Estelle A. Searcy Miyoshi MD; 10/10/2014 3:15 PM)  General Mental Status-Alert. General Appearance-Consistent with stated age. Hydration-Well hydrated. Voice-Normal.  Head and Neck Head-normocephalic, atraumatic with no lesions or palpable masses. Trachea-midline. Thyroid Gland Characteristics - normal size and  consistency.  Eye Eyeball - Bilateral-Extraocular movements intact. Sclera/Conjunctiva - Bilateral-No scleral icterus.  Chest and Lung Exam Chest and lung exam reveals -quiet, even and easy respiratory effort with no use of accessory muscles and on auscultation, normal breath sounds, no adventitious sounds and normal vocal resonance. Inspection Chest Wall - Normal. Back - normal.  Breast Breast - Left-Symmetric, Non Tender, No Biopsy scars, no Dimpling, No Inflammation, No Lumpectomy scars, No Mastectomy scars, No Peau d' Orange. Breast - Right-Symmetric, Non Tender, No Biopsy scars, no Dimpling, No Inflammation, No Lumpectomy scars, No Mastectomy scars, No Peau d' Orange. Breast Lump-No Palpable Breast Mass. Note: very small breasts bilaterally   Cardiovascular  Cardiovascular examination reveals -normal heart sounds, regular rate and rhythm with no murmurs and normal pedal pulses bilaterally.  Abdomen Inspection Inspection of the abdomen reveals - No Hernias. Skin - Scar - no surgical scars. Palpation/Percussion Palpation and Percussion of the abdomen reveal - Soft, Non Tender, No Rebound tenderness, No Rigidity (guarding) and No hepatosplenomegaly. Auscultation Auscultation of the abdomen reveals - Bowel sounds normal.  Neurologic Neurologic evaluation reveals -alert and oriented x 3 with no impairment of recent or remote memory. Mental Status-Normal.  Musculoskeletal Normal Exam - Left-Upper Extremity Strength Normal and Lower Extremity Strength Normal. Normal Exam - Right-Upper Extremity Strength Normal and Lower Extremity Strength Normal.  Lymphatic Head & Neck  General Head & Neck Lymphatics: Bilateral - Description - Normal. Axillary  General Axillary Region: Bilateral - Description - Normal. Tenderness - Non Tender. Femoral & Inguinal - Did not examine.    Assessment & Plan (Bronislaus Verdell A. Janiece Scovill MD; 10/10/2014 3:19 PM)  BREAST CANCER, RIGHT  (174.9  C50.911) Impression: discussed breast conservation vs NSM given small dense breasts bilaterally. She would have a significant cosmetic defect with lumpectomy. Discussed SLN mapping. She will think over her options and let me know next week. Discussed treatment options for breast cancer to include breast conservation vs mastectomy with reconstruction. Pt has decided on mastectomy. Risk include bleeding, infection, flap necrosis, pain, numbness, recurrence, hematoma, other surgery needs. Pt understands and agrees to proceed. Risk of lumpectomy include bleeding, infection, seroma, more surgery, use of seed/wire, wound care, cosmetic deformity and the need for other treatments, death , blood clots, death. Pt agrees to proceed. Risk of sentinel lymph node mapping include bleeding, infection, lymphedema, shoulder pain. stiffness, dye allergy. cosmetic deformity , blood clots, death, need for more surgery. Pt agres to proceed.  Current Plans Pt Education - Patient information: Breast cancer (The Basics): discussed with patient and provided information. Pt Education - Overview of the treatment of newly diagnosed, non-metastatic breast cancer: discussed with patient and provided information. Pt Education - Patient information: Choosing treatment for early-stage breast cancer (The Basics): discussed with patient and provided information. Pt Education - Patient information: Sentinel lymph node biopsy for breast cancer (The Basics): discussed with patient and provided information. Pt Education - CCS Mastectomy HCI Pt Education - [facility]CS Breast Biopsy HCI DENSE BREAST TISSUE (793.82  R92.2) Impression: would benefit from NSM on this size for symetry since this side will need augmentation as well. This side is very dense.

## 2014-10-10 NOTE — Progress Notes (Signed)
Radiation Oncology         [phone] ________________________________  Initial Outpatient Consultation  Name: Tracey Mckinney MRN: [id number]  Date: 10/10/2014  DOB: 1960/08/11  OZ:[id number], Tommi Rumps, MD  Erroll Luna, MD   REFERRING PHYSICIAN: Erroll Luna, MD  DIAGNOSIS: The encounter diagnosis was Breast cancer of upper-outer quadrant of right female breast.  HISTORY OF PRESENT ILLNESS::Tracey Mckinney is a 54 y.o. female who is seen by the [facility] as a part of the interdisciplinary clinic. Patient reported palpable bilateral lumps and on 12/04/00 mammogram revealed cysts in both breasts, the left cyst was drained. On 07/06/11 mammogram, multiple cysts was found in the left breast that were benign. On 09/24/14 mammogram/tomosynthesis shows an area of concern in the right upper outer quadrant of the breast. Ultrasound confirms a a 1.8 cm mass, 3 cm from the nipple and a nodule 2 cm from the nipple. Biopsy on 09/28/14  revealed the 1.8 cm mass to be grade I IDC, DCIS and atypical lobular hyperplasia. The nodule 2 cm from the nipple showed fibrocystic changes with usual ductal hyperplasia. Pathology indicates ER/PR positive, Ki-67 5%.    PREVIOUS RADIATION THERAPY: No  PAST MEDICAL HISTORY:  has a past medical history of Chronic fatigue; Thyroid disease; Gallstones; and Arthritis.    PAST SURGICAL HISTORY: Past Surgical History  Procedure Laterality Date  . Refractive surgery      FAMILY HISTORY: family history includes Breast cancer in her mother.  SOCIAL HISTORY:  reports that she has never smoked. She does not have any smokeless tobacco history on file. She reports that she does not drink alcohol or use illicit drugs.  ALLERGIES: Review of patient's allergies indicates no known allergies.  MEDICATIONS:  Current Outpatient Prescriptions  Medication Sig Dispense Refill  . Cholecalciferol (VITAMIN D3) 5000 UNITS CAPS Take by mouth.    Marland Kitchen glucosamine-chondroitin 500-400  MG tablet Take 1 tablet by mouth.    . levalbuterol (XOPENEX HFA) 45 MCG/ACT inhaler Inhale into the lungs every 4 (four) hours as needed for wheezing.    . metroNIDAZOLE (METROCREAM) 0.75 % cream Apply topically 2 (two) times daily.    . Probiotic Product (ACIDOPHILUS/GOAT MILK) CAPS Take by mouth.    Marland Kitchen UNABLE TO FIND Thyroid 1/4 grain     No current facility-administered medications for this encounter.    REVIEW OF SYSTEMS:  A 15 point review of systems is documented in the electronic medical record. This was obtained by the nursing staff. However, I reviewed this with the patient to discuss relevant findings and make appropriate changes.  No breast pain nipple discharge or bleeding   PHYSICAL EXAM:  Vitals with Age-Percentiles 10/10/2014  Length 696.2 cm  Systolic 952  Diastolic 64  Pulse 69  Respiration 18  Weight 57.788 kg  BMI 20.6  Pleasant 54 year old female with no acute stress accompanied with her husband. Exam of left breast revealed fibrocystic changes, right breast exam also shows fibrocystic changes, in upper outer quadrant where the suspected 1.5x1.5 nodule was found. Bruising in 3 o'clock position  No palpable cervical, supraclavicular or axillary lymphoadenopathy General: Well-developed, in no acute distress HEENT: Normocephalic, atraumatic Cardiovascular: Regular rate and rhythm Respiratory: Clear to auscultation bilaterally GI: Soft, nontender, normal bowel sounds Extremities: No edema present    ECOG = 0  0 - Asymptomatic (Fully active, able to carry on all predisease activities without restriction)   LABORATORY DATA:  Lab Results  Component Value Date   WBC 7.3 10/10/2014  HGB 13.5 10/10/2014   HCT 40.2 10/10/2014   MCV 84.9 10/10/2014   PLT 213 10/10/2014   NEUTROABS 4.7 10/10/2014   Lab Results  Component Value Date   NA 143 10/10/2014   K 4.0 10/10/2014   CO2 31* 10/10/2014   GLUCOSE 107 10/10/2014   CREATININE 1.0 10/10/2014   CALCIUM 10.1  10/10/2014      RADIOGRAPHY: US Breast Ltd Uni Left Inc Axilla  09/24/2014   CLINICAL DATA:  Palpable abnormalities bilaterally. History of cysts and aspirations.  EXAM: DIGITAL DIAGNOSTIC BILATERAL MAMMOGRAM WITH 3D TOMOSYNTHESIS WITH CAD  ULTRASOUND BILATERAL BREAST  COMPARISON:  07/12/2013 and earlier  ACR Breast Density Category c: The breast tissue is heterogeneously dense, which may obscure small masses.  FINDINGS: Within the upper-outer quadrant of the right breast there is distortion correlating with the area of patient's concern, best seen on the craniocaudal view. Within the central portion of the left breast there is a small rounded partially obscured density further evaluated with ultrasound.  Mammographic images were processed with CAD.  On physical exam, I palpate a focal area of firm thickening in the 10 o'clock location of the right breast, extending radially towards the nipple. I palpate no right axillary adenopathy. I palpate no discrete abnormality in the periareolar region of the left breast.  Targeted ultrasound is performed, showing an irregular hypoechoic mass in the 10 o'clock location of the right breast 3 cm from the nipple. This measures 1.8 x 1.0 x 1.6 cm and is associated with internal blood flow on Doppler evaluation. Biopsy is recommended. Approximately 2.2 cm medial to this nodule there are 3 small hypoechoic nodules measuring 0.5, 0.6, and 0.5 cm. These contain internal echoes. Two are associated with increased acoustic transmission. One is associated with slightly more regular walls and possible acoustic shadowing. I would recommend biopsy of the more irregular nodule. Evaluation of the right axilla is negative for adenopathy.  Ultrasound evaluation of the periareolar region of the left breast demonstrates numerous hypoechoic and anechoic lesions, consistent with cysts and fibrocystic changes. No suspicious nodules are identified on the left.  IMPRESSION: 1. Suspicious irregular  mass in the 10 o'clock location of the right breast warrants tissue diagnosis. 2. Numerous small nodules bilaterally. Small irregular nodule in the 10 o'clock location of the right breast 1 cm from the nipple is slightly suspicious and biopsy is recommended.  RECOMMENDATION: 1. Ultrasound-guided core biopsy mass in the 10 o'clock location of the right breast 3 cm from nipple. 2. Ultrasound-guided core biopsy of nodule in the 10 o'clock location of the right breast 1 cm from the nipple.  I have discussed the findings and recommendations with the patient. Results were also provided in writing at the conclusion of the visit. If applicable, a reminder letter will be sent to the patient regarding the next appointment.  BI-RADS CATEGORY  4: Suspicious.   Electronically Signed   By: Nolon Nations M.D.   On: 09/24/2014 17:22   US Breast Ltd Uni Right Inc Axilla  09/24/2014   CLINICAL DATA:  Palpable abnormalities bilaterally. History of cysts and aspirations.  EXAM: DIGITAL DIAGNOSTIC BILATERAL MAMMOGRAM WITH 3D TOMOSYNTHESIS WITH CAD  ULTRASOUND BILATERAL BREAST  COMPARISON:  07/12/2013 and earlier  ACR Breast Density Category c: The breast tissue is heterogeneously dense, which may obscure small masses.  FINDINGS: Within the upper-outer quadrant of the right breast there is distortion correlating with the area of patient's concern, best seen on the craniocaudal view. Within the  central portion of the left breast there is a small rounded partially obscured density further evaluated with ultrasound.  Mammographic images were processed with CAD.  On physical exam, I palpate a focal area of firm thickening in the 10 o'clock location of the right breast, extending radially towards the nipple. I palpate no right axillary adenopathy. I palpate no discrete abnormality in the periareolar region of the left breast.  Targeted ultrasound is performed, showing an irregular hypoechoic mass in the 10 o'clock location of the right  breast 3 cm from the nipple. This measures 1.8 x 1.0 x 1.6 cm and is associated with internal blood flow on Doppler evaluation. Biopsy is recommended. Approximately 2.2 cm medial to this nodule there are 3 small hypoechoic nodules measuring 0.5, 0.6, and 0.5 cm. These contain internal echoes. Two are associated with increased acoustic transmission. One is associated with slightly more regular walls and possible acoustic shadowing. I would recommend biopsy of the more irregular nodule. Evaluation of the right axilla is negative for adenopathy.  Ultrasound evaluation of the periareolar region of the left breast demonstrates numerous hypoechoic and anechoic lesions, consistent with cysts and fibrocystic changes. No suspicious nodules are identified on the left.  IMPRESSION: 1. Suspicious irregular mass in the 10 o'clock location of the right breast warrants tissue diagnosis. 2. Numerous small nodules bilaterally. Small irregular nodule in the 10 o'clock location of the right breast 1 cm from the nipple is slightly suspicious and biopsy is recommended.  RECOMMENDATION: 1. Ultrasound-guided core biopsy mass in the 10 o'clock location of the right breast 3 cm from nipple. 2. Ultrasound-guided core biopsy of nodule in the 10 o'clock location of the right breast 1 cm from the nipple.  I have discussed the findings and recommendations with the patient. Results were also provided in writing at the conclusion of the visit. If applicable, a reminder letter will be sent to the patient regarding the next appointment.  BI-RADS CATEGORY  4: Suspicious.   Electronically Signed   By: Nolon Nations M.D.   On: 09/24/2014 17:22   Mm Diag Breast Tomo Uni Right  09/28/2014   CLINICAL DATA:  Status post ultrasound-guided core biopsy of 2 lesions in the right breast.  EXAM: DIAGNOSTIC RIGHT MAMMOGRAM POST ULTRASOUND BIOPSIES  COMPARISON:  Previous exam(s).  FINDINGS: Mammographic images were obtained following ultrasound guided biopsy  of mass in the 10 o'clock location of the right breast 5 cm from the nipple and a second mass in the 10 o'clock location of the right breast 2 cm from the nipple. The 2 clips are identified in the upper-outer quadrant of the right breast as expected. Clips are approximately 3.4 cm apart.  IMPRESSION: Tissue marker clips are in expected locations after biopsy.  Final Assessment: Post Procedure Mammograms for Marker Placement   Electronically Signed   By: Nolon Nations M.D.   On: 09/28/2014 16:52   Mm Diag Breast Tomo Bilateral  09/24/2014   CLINICAL DATA:  Palpable abnormalities bilaterally. History of cysts and aspirations.  EXAM: DIGITAL DIAGNOSTIC BILATERAL MAMMOGRAM WITH 3D TOMOSYNTHESIS WITH CAD  ULTRASOUND BILATERAL BREAST  COMPARISON:  07/12/2013 and earlier  ACR Breast Density Category c: The breast tissue is heterogeneously dense, which may obscure small masses.  FINDINGS: Within the upper-outer quadrant of the right breast there is distortion correlating with the area of patient's concern, best seen on the craniocaudal view. Within the central portion of the left breast there is a small rounded partially obscured density further evaluated  with ultrasound.  Mammographic images were processed with CAD.  On physical exam, I palpate a focal area of firm thickening in the 10 o'clock location of the right breast, extending radially towards the nipple. I palpate no right axillary adenopathy. I palpate no discrete abnormality in the periareolar region of the left breast.  Targeted ultrasound is performed, showing an irregular hypoechoic mass in the 10 o'clock location of the right breast 3 cm from the nipple. This measures 1.8 x 1.0 x 1.6 cm and is associated with internal blood flow on Doppler evaluation. Biopsy is recommended. Approximately 2.2 cm medial to this nodule there are 3 small hypoechoic nodules measuring 0.5, 0.6, and 0.5 cm. These contain internal echoes. Two are associated with increased  acoustic transmission. One is associated with slightly more regular walls and possible acoustic shadowing. I would recommend biopsy of the more irregular nodule. Evaluation of the right axilla is negative for adenopathy.  Ultrasound evaluation of the periareolar region of the left breast demonstrates numerous hypoechoic and anechoic lesions, consistent with cysts and fibrocystic changes. No suspicious nodules are identified on the left.  IMPRESSION: 1. Suspicious irregular mass in the 10 o'clock location of the right breast warrants tissue diagnosis. 2. Numerous small nodules bilaterally. Small irregular nodule in the 10 o'clock location of the right breast 1 cm from the nipple is slightly suspicious and biopsy is recommended.  RECOMMENDATION: 1. Ultrasound-guided core biopsy mass in the 10 o'clock location of the right breast 3 cm from nipple. 2. Ultrasound-guided core biopsy of nodule in the 10 o'clock location of the right breast 1 cm from the nipple.  I have discussed the findings and recommendations with the patient. Results were also provided in writing at the conclusion of the visit. If applicable, a reminder letter will be sent to the patient regarding the next appointment.  BI-RADS CATEGORY  4: Suspicious.   Electronically Signed   By: Nolon Nations M.D.   On: 09/24/2014 17:22   [place] Rt Breast Bx W Loc Dev 1st Lesion Img Bx Spec US Guide  10/02/2014   ADDENDUM REPORT: 10/02/2014 08:03  ADDENDUM: Pathology revealed grade I invasive ductal carcinoma, ductal carcinoma in situ, and atypical lobular hyperplasia in the right breast at 10:00 5 cm from the nipple. The right breast at 10:00 2 cm from the nipple showed fibrocystic changes with usual ductal hyperplasia. This was found to be concordant by Dr. Shon Hale. Pathology was discussed with the patient by telephone. She reported doing well after the biopsies with tenderness at the sites. Post biopsy instructions and care were reviewed and her questions  were answered. She asked that the reports be faxed to her primary care physician, Dr. Mylinda Latina, which was done. After speaking with him, she called back to [facility] and asked to be referred to [facility]. Her appointment is on October 10, 2014. My number was provided for additional questions and concerns.  Pathology results reported by Susa Raring RN, BSN on October 02, 2014.   Electronically Signed   By: Nolon Nations M.D.   On: 10/02/2014 08:03   10/02/2014   CLINICAL DATA:  Patient returns for ultrasound-guided core biopsy of a a mass in the 10 o'clock location of the right breast 5 cm from the nipple and a second mass in the 10 o'clock location of the right breast 2 cm from the nipple.  EXAM: ULTRASOUND GUIDED RIGHT BREAST CORE NEEDLE BIOPSY 2 SITES  COMPARISON:  Previous exam(s).  FINDINGS: I met with the patient and we discussed the procedure of ultrasound-guided biopsy, including benefits and alternatives. We discussed the high likelihood of a successful procedure. We discussed the risks of the procedure, including infection, bleeding, tissue injury, clip migration, and inadequate sampling. Informed written consent was given. The usual time-out protocol was performed immediately prior to the procedure.  Using sterile technique and 2% Lidocaine as local anesthetic, under direct ultrasound visualization, a 14 gauge spring-loaded device was used to perform biopsy of the more solid of 3 masses in the 10 o'clock location of the right breast 5 cm from the nipple using a inferolateral approach. At the conclusion of procedure a ribbon shaped tissue marker clip was deployed into the biopsy cavity.  Using the same technique mass in the 10 o'clock location of the right breast 2 cm from the nipple was biopsied using an inferolateral approach. At the conclusion of procedure a coil shaped clip was deployed into the biopsy cavity.  Follow up 2 view  mammogram was performed and dictated separately.  IMPRESSION: Ultrasound guided biopsy of right breast nodules. No apparent complications.  Electronically Signed: By: Nolon Nations M.D. On: 09/28/2014 16:51   [place] Rt Breast Bx W Loc Dev Ea Add Lesion Img Bx Spec US Guide  10/02/2014   ADDENDUM REPORT: 10/02/2014 08:03  ADDENDUM: Pathology revealed grade I invasive ductal carcinoma, ductal carcinoma in situ, and atypical lobular hyperplasia in the right breast at 10:00 5 cm from the nipple. The right breast at 10:00 2 cm from the nipple showed fibrocystic changes with usual ductal hyperplasia. This was found to be concordant by Dr. Shon Hale. Pathology was discussed with the patient by telephone. She reported doing well after the biopsies with tenderness at the sites. Post biopsy instructions and care were reviewed and her questions were answered. She asked that the reports be faxed to her primary care physician, Dr. Mylinda Latina, which was done. After speaking with him, she called back to [facility] and asked to be referred to [facility]. Her appointment is on October 10, 2014. My number was provided for additional questions and concerns.  Pathology results reported by Susa Raring RN, BSN on October 02, 2014.   Electronically Signed   By: Nolon Nations M.D.   On: 10/02/2014 08:03   10/02/2014   CLINICAL DATA:  Patient returns for ultrasound-guided core biopsy of a a mass in the 10 o'clock location of the right breast 5 cm from the nipple and a second mass in the 10 o'clock location of the right breast 2 cm from the nipple.  EXAM: ULTRASOUND GUIDED RIGHT BREAST CORE NEEDLE BIOPSY 2 SITES  COMPARISON:  Previous exam(s).  FINDINGS: I met with the patient and we discussed the procedure of ultrasound-guided biopsy, including benefits and alternatives. We discussed the high likelihood of a successful procedure. We discussed the risks of the  procedure, including infection, bleeding, tissue injury, clip migration, and inadequate sampling. Informed written consent was given. The usual time-out protocol was performed immediately prior to the procedure.  Using sterile technique and 2% Lidocaine as local anesthetic, under direct ultrasound visualization, a 14 gauge spring-loaded device was used to perform biopsy of the more solid of 3 masses in the 10 o'clock location of the right breast 5 cm from the nipple using a inferolateral approach. At the conclusion of procedure a ribbon shaped tissue marker clip was deployed into the  biopsy cavity.  Using the same technique mass in the 10 o'clock location of the right breast 2 cm from the nipple was biopsied using an inferolateral approach. At the conclusion of procedure a coil shaped clip was deployed into the biopsy cavity.  Follow up 2 view mammogram was performed and dictated separately.  IMPRESSION: Ultrasound guided biopsy of right breast nodules. No apparent complications.  Electronically Signed: By: Nolon Nations M.D. On: 09/28/2014 16:51      IMPRESSION: Patient will be seen by surgery to see if she is breast conservation candidate. She may need MRI based on Dr. Josetta Huddle advice. Patient appears to be breast conservation candidtate but may have architectural distortion  if she chooses this approach.    PLAN: We discussed the possible side effects and risks of treatment in addition to the possible benefits of treatment and appears to be receptive to radiation treatment. We discussed the protocol for radiation treatment.  All of the patient's questions were answered. The patient does wish to proceed with this treatment. Final details are pending surgical evaluation.  An MRI will be ordered.       ------------------------------------------------  Blair Promise, PhD, MD   This document serves as a record of services personally performed by Gery Pray, MD. It was created on his behalf by  Derek Mound, a trained medical scribe. The creation of this record is based on the scribe's personal observations and the provider's statements to them. This document has been checked and approved by the attending provider.

## 2014-10-10 NOTE — Progress Notes (Signed)
Note created by Dr. Gudena during office visit. Copy to patient, original to scan. 

## 2014-10-11 ENCOUNTER — Telehealth: Payer: Self-pay | Admitting: Hematology and Oncology

## 2014-10-11 ENCOUNTER — Other Ambulatory Visit: Payer: Self-pay | Admitting: *Deleted

## 2014-10-11 NOTE — Telephone Encounter (Signed)
lvm for pt regarding to JUNE appt.... °

## 2014-10-12 ENCOUNTER — Telehealth: Payer: Self-pay | Admitting: Hematology and Oncology

## 2014-10-12 ENCOUNTER — Telehealth: Payer: Self-pay | Admitting: *Deleted

## 2014-10-12 NOTE — Telephone Encounter (Signed)
returned call and s.w. pt and confirmed appt and location

## 2014-10-12 NOTE — Telephone Encounter (Signed)
TC from patient to confirm her appt with Ernestene Kiel, nutritionist, on Tuesday, 10/16/14 @ 2:45 pm.  Patient also stated she had many food allergies. Advised pt to bring a list of those allergies with her. She stated she would. No other needs identified.

## 2014-10-15 ENCOUNTER — Telehealth: Payer: Self-pay | Admitting: *Deleted

## 2014-10-15 ENCOUNTER — Telehealth: Payer: Self-pay | Admitting: Genetic Counselor

## 2014-10-15 NOTE — Telephone Encounter (Addendum)
Received a message from Tracey Mckinney that Tracey Mckinney had futher questions about genetic testing.  Called Tracey Mckinney, and confirmed that she has a mother and maternal aunt who also had breast cancer.  Tracey Mckinney meets medical criteria for genetic testing.  Explained that genetic testing in general is used to 1) determine surgical decisions, and at times chemotherapy decisions, on this particular cancer, 2) determine how to follow patients after the current cancer is treated, and 3) determine who else in the family may need to be tested or is at risk for developing cancer.  She is unsure if she wants to pursue testing right now, but will think about it.  She will contact [facility] if she decides to proceed with testing.

## 2014-10-15 NOTE — Telephone Encounter (Signed)
Left vm for pt to return call regarding [facility] from 10/10/14. Contact information given.

## 2014-10-16 ENCOUNTER — Encounter: Payer: Self-pay | Admitting: *Deleted

## 2014-10-16 ENCOUNTER — Ambulatory Visit: Payer: Managed Care, Other (non HMO) | Admitting: Nutrition

## 2014-10-16 NOTE — Progress Notes (Signed)
Met with pt to discuss f/u from [facility] on 10/10/14. Pt relate she has decided she would like to have a lumpectomy and radiation. She would like to schedule an appt for genetic counseling, but does not want to wait for the results for surgery to be scheduled. Pt request to transfer care to Dr. Barry Dienes for surgery and Dr. Isidore Moos for radiation. She relayed she "tends to gravitate to female physicians". Sent the request to Drs. Barnie Del and informed Drs. Cornett and Kinard of the patients request.  Pt will still be seen by Dr. Iran Planas to discuss possible oncoplasty after surgery and xrt. Encourage pt to call with questions or concerns. Received verbal understanding.

## 2014-10-16 NOTE — Progress Notes (Signed)
54 year old female diagnosed with ER positive, PR positive breast cancer.  Past medical history includes chronic fatigue syndrome, thyroid disease, gallstones, and arthritis.  Medications include vitamin D 3, glucosamine/chondroitin, and probiotics.  Labs were reviewed.  Height: 66 inches. Weight: 127.4 pounds. Usual body weight: 135 pounds. BMI: 20.57.  Patient reports she follows a gluten-free, no dairy diet.  She also tries to eat vegetarian foods but will consume fish. Patient has been told not to consume soy. Patient is followed by integrative medicine who ran tests and provided a list of specific foods to avoid. Patient complains of chronic fatigue. She has tried to adapt her diet to be lower in fat secondary to gallstones. Patient requesting assistance with "on the go" meals and snacks.  Nutrition diagnosis: Food and nutrition related knowledge deficit related to diagnosis of breast cancer as evidenced by no prior need for nutrition related information.  Intervention: Patient and I reviewed allowed foods on present diet.  I offered suggestions for on the go easy to prepare meals and snacks. Recommended patient discuss soy protein with physician to see if she could add in some soy foods for additional options. Questions were answered.  Teach back method used.  Contact information was provided.  Monitoring, evaluation, goals: Patient will tolerate healthy diet to minimize weight loss throughout treatment.  Patient contact me with questions or concerns.  **Disclaimer: This note was dictated with voice recognition software. Similar sounding words can inadvertently be transcribed and this note may contain transcription errors which may not have been corrected upon publication of note.**

## 2014-10-17 ENCOUNTER — Telehealth: Payer: Self-pay | Admitting: Hematology and Oncology

## 2014-10-17 ENCOUNTER — Encounter: Payer: Self-pay | Admitting: *Deleted

## 2014-10-17 NOTE — Telephone Encounter (Signed)
Patient called back and left a message to reschedule 7/5,i have called her back and left a message with a new appointment

## 2014-10-17 NOTE — Telephone Encounter (Signed)
Spoke with patient and she is aware of her appointments °

## 2014-10-18 ENCOUNTER — Telehealth: Payer: Self-pay | Admitting: *Deleted

## 2014-10-18 NOTE — Telephone Encounter (Signed)
Left VM requesting patient arrive at [facility] tomorrow at 0915 instead of 11:00 to see Dr. Barry Dienes. Gave her my office # to call early tomorrow to confirm or cancel.

## 2014-10-19 ENCOUNTER — Other Ambulatory Visit: Payer: Self-pay | Admitting: General Surgery

## 2014-10-19 DIAGNOSIS — C50411 Malignant neoplasm of upper-outer quadrant of right female breast: Secondary | ICD-10-CM

## 2014-10-23 ENCOUNTER — Encounter: Payer: Managed Care, Other (non HMO) | Admitting: Genetic Counselor

## 2014-10-23 ENCOUNTER — Telehealth: Payer: Self-pay | Admitting: *Deleted

## 2014-10-23 ENCOUNTER — Other Ambulatory Visit: Payer: Managed Care, Other (non HMO)

## 2014-10-23 ENCOUNTER — Other Ambulatory Visit: Payer: Self-pay | Admitting: General Surgery

## 2014-10-23 DIAGNOSIS — C50411 Malignant neoplasm of upper-outer quadrant of right female breast: Secondary | ICD-10-CM

## 2014-10-23 NOTE — Telephone Encounter (Signed)
Pt called to cancel her genetic appt this morning d/t she has work. Cancelled her appt with Kayla. Pt will call when she has a free moment to schedule genetics appt.

## 2014-10-25 ENCOUNTER — Encounter: Payer: Self-pay | Admitting: Hematology and Oncology

## 2014-10-25 NOTE — Progress Notes (Signed)
Pt had questions and concerns about financial assistance.  I informed her that at this time unfortunately, I didn't have any programs to assist her because she's not in active treatment and her balance is $50.  Pt informed me that she will be starting radiation after surgery so I gave her Meredith's number to contact her with any questions in regards to that treatment.  The service center called her for a deposit so I gave her the hospital's pt accounting number to inquire about a payment plan or the [employer] card.  She also mentioned she received a bill from her surgeon.  I advised she call her surgeon's office to inquire about any financial assistance programs.

## 2014-10-30 ENCOUNTER — Ambulatory Visit
Admission: RE | Admit: 2014-10-30 | Discharge: 2014-10-30 | Disposition: A | Discharge status: Home / Self Care | Payer: Managed Care, Other (non HMO) | Source: Ambulatory Visit | Attending: General Surgery | Admitting: General Surgery

## 2014-10-30 DIAGNOSIS — C50411 Malignant neoplasm of upper-outer quadrant of right female breast: Secondary | ICD-10-CM

## 2014-10-31 ENCOUNTER — Encounter (HOSPITAL_COMMUNITY)
Admission: RE | Admit: 2014-10-31 | Discharge: 2014-10-31 | Disposition: A | Discharge status: Home / Self Care | Payer: Managed Care, Other (non HMO) | Source: Ambulatory Visit | Attending: General Surgery | Admitting: General Surgery

## 2014-10-31 ENCOUNTER — Encounter (HOSPITAL_COMMUNITY): Payer: Self-pay

## 2014-10-31 DIAGNOSIS — K8010 Calculus of gallbladder with chronic cholecystitis without obstruction: Secondary | ICD-10-CM | POA: Diagnosis not present

## 2014-10-31 DIAGNOSIS — E039 Hypothyroidism, unspecified: Secondary | ICD-10-CM | POA: Diagnosis not present

## 2014-10-31 DIAGNOSIS — C50911 Malignant neoplasm of unspecified site of right female breast: Secondary | ICD-10-CM | POA: Diagnosis present

## 2014-10-31 HISTORY — DX: Hypothyroidism, unspecified: E03.9

## 2014-10-31 HISTORY — DX: Other specified postprocedural states: R11.2

## 2014-10-31 HISTORY — DX: Major depressive disorder, single episode, unspecified: F32.9

## 2014-10-31 HISTORY — DX: Unspecified asthma, uncomplicated: J45.909

## 2014-10-31 HISTORY — DX: Other specified postprocedural states: Z98.890

## 2014-10-31 HISTORY — DX: Depression, unspecified: F32.A

## 2014-10-31 HISTORY — DX: Other specified behavioral and emotional disorders with onset usually occurring in childhood and adolescence: F98.8

## 2014-10-31 HISTORY — DX: Allergy to other foods: Z91.018

## 2014-10-31 LAB — CBC
HEMATOCRIT: 38.8 % (ref 36.0–46.0)
HEMOGLOBIN: 13.2 g/dL (ref 12.0–15.0)
MCH: 29.3 pg (ref 26.0–34.0)
MCHC: 34 g/dL (ref 30.0–36.0)
MCV: 86 fL (ref 78.0–100.0)
Platelets: 176 10*3/uL (ref 150–400)
RBC: 4.51 MIL/uL (ref 3.87–5.11)
RDW: 13 % (ref 11.5–15.5)
WBC: 5.5 10*3/uL (ref 4.0–10.5)

## 2014-10-31 MED ORDER — CEFAZOLIN SODIUM-DEXTROSE 2-3 GM-% IV SOLR
2.0000 g | INTRAVENOUS | Status: AC
  Administered 2014-11-01: 2 g via INTRAVENOUS
  Filled 2014-10-31: qty 50

## 2014-10-31 NOTE — Pre-Procedure Instructions (Signed)
Tracey Mckinney  10/31/2014      [id number] - [place], [place] - [address] [place] [address] Phone: [phone] Fax: [phone]    Your procedure is scheduled on Thursday, July 14th   Report to [facility] Admitting at 7:15 AM  Call this number if you have problems the morning of surgery:  [phone]   Remember:  Do not eat food or drink liquids after midnight tonight.  Take these medicines the morning of surgery is your inhaler.   Do not wear jewelry, make-up or nail polish.  Do not wear lotions, powders, or perfumes.  You may NOT wear deodorant.  Do not shave underarms & legs 48 hours prior to surgery.     Do not bring valuables to the hospital.  [facility] is not responsible for any belongings or valuables.  Contacts, dentures or bridgework may not be worn into surgery.  Leave your suitcase in the car.  After surgery it may be brought to your room. Patients discharged the day of surgery will not be allowed to drive home.   Name and phone number of your driver:    Special instructions:  "Preparing for Surgery" instruction sheet.  Please read over the following fact sheets that you were given. Pain Booklet, Coughing and Deep Breathing and Surgical Site Infection Prevention

## 2014-10-31 NOTE — Progress Notes (Addendum)
States she had palpitations back in 2008, saw a cardio in [place].  They did holter monitor but results were neg.  PCP thinks it may have been thyroid related.  PCP is Dr. Mylinda Latina  [phone] Having no issues at the present.

## 2014-11-01 ENCOUNTER — Ambulatory Visit
Admission: RE | Admit: 2014-11-01 | Discharge: 2014-11-01 | Disposition: A | Discharge status: Home / Self Care | Payer: Managed Care, Other (non HMO) | Source: Ambulatory Visit | Attending: General Surgery | Admitting: General Surgery

## 2014-11-01 ENCOUNTER — Encounter (HOSPITAL_COMMUNITY): Payer: Self-pay | Admitting: Certified Registered Nurse Anesthetist

## 2014-11-01 ENCOUNTER — Other Ambulatory Visit: Payer: Managed Care, Other (non HMO)

## 2014-11-01 ENCOUNTER — Ambulatory Visit (HOSPITAL_COMMUNITY): Payer: Managed Care, Other (non HMO) | Admitting: Certified Registered Nurse Anesthetist

## 2014-11-01 ENCOUNTER — Encounter (HOSPITAL_COMMUNITY)
Admission: RE | Disposition: A | Discharge status: Home / Self Care | Payer: Self-pay | Source: Ambulatory Visit | Attending: General Surgery

## 2014-11-01 ENCOUNTER — Encounter: Payer: Managed Care, Other (non HMO) | Admitting: Genetic Counselor

## 2014-11-01 ENCOUNTER — Ambulatory Visit (HOSPITAL_COMMUNITY)
Admission: RE | Admit: 2014-11-01 | Discharge: 2014-11-01 | Disposition: A | Discharge status: Home / Self Care | Payer: Managed Care, Other (non HMO) | Source: Ambulatory Visit | Attending: General Surgery | Admitting: General Surgery

## 2014-11-01 DIAGNOSIS — C50911 Malignant neoplasm of unspecified site of right female breast: Secondary | ICD-10-CM | POA: Insufficient documentation

## 2014-11-01 DIAGNOSIS — C50411 Malignant neoplasm of upper-outer quadrant of right female breast: Secondary | ICD-10-CM

## 2014-11-01 DIAGNOSIS — K8010 Calculus of gallbladder with chronic cholecystitis without obstruction: Secondary | ICD-10-CM | POA: Insufficient documentation

## 2014-11-01 DIAGNOSIS — E039 Hypothyroidism, unspecified: Secondary | ICD-10-CM | POA: Insufficient documentation

## 2014-11-01 HISTORY — PX: CHOLECYSTECTOMY: SHX55

## 2014-11-01 HISTORY — PX: BREAST LUMPECTOMY WITH RADIOACTIVE SEED AND SENTINEL LYMPH NODE BIOPSY: SHX6550

## 2014-11-01 HISTORY — PX: BREAST LUMPECTOMY: SHX2

## 2014-11-01 SURGERY — BREAST LUMPECTOMY WITH RADIOACTIVE SEED AND SENTINEL LYMPH NODE BIOPSY
Anesthesia: General | Site: Breast | Laterality: Right

## 2014-11-01 MED ORDER — MIDAZOLAM HCL 2 MG/2ML IJ SOLN
INTRAMUSCULAR | Status: AC
  Filled 2014-11-01: qty 2

## 2014-11-01 MED ORDER — DEXAMETHASONE SODIUM PHOSPHATE 4 MG/ML IJ SOLN
INTRAMUSCULAR | Status: DC | PRN
  Administered 2014-11-01: 4 mg via INTRAVENOUS

## 2014-11-01 MED ORDER — FENTANYL CITRATE (PF) 100 MCG/2ML IJ SOLN
100.0000 ug | Freq: Once | INTRAMUSCULAR | Status: AC
  Administered 2014-11-01: 100 ug via INTRAVENOUS

## 2014-11-01 MED ORDER — FENTANYL CITRATE (PF) 250 MCG/5ML IJ SOLN
INTRAMUSCULAR | Status: AC
  Filled 2014-11-01: qty 5

## 2014-11-01 MED ORDER — GLYCOPYRROLATE 0.2 MG/ML IJ SOLN
INTRAMUSCULAR | Status: AC
  Filled 2014-11-01: qty 3

## 2014-11-01 MED ORDER — LIDOCAINE HCL 1 % IJ SOLN
INTRAMUSCULAR | Status: DC | PRN
  Administered 2014-11-01: 40 mL via INTRAMUSCULAR

## 2014-11-01 MED ORDER — LIDOCAINE HCL (CARDIAC) 20 MG/ML IV SOLN
INTRAVENOUS | Status: AC
  Filled 2014-11-01: qty 5

## 2014-11-01 MED ORDER — LACTATED RINGERS IV SOLN
INTRAVENOUS | Status: DC
  Administered 2014-11-01 (×2): via INTRAVENOUS

## 2014-11-01 MED ORDER — SCOPOLAMINE 1 MG/3DAYS TD PT72
MEDICATED_PATCH | TRANSDERMAL | Status: AC
  Filled 2014-11-01: qty 1

## 2014-11-01 MED ORDER — FENTANYL CITRATE (PF) 100 MCG/2ML IJ SOLN
INTRAMUSCULAR | Status: AC
  Filled 2014-11-01: qty 2

## 2014-11-01 MED ORDER — HYDROMORPHONE HCL 1 MG/ML IJ SOLN
INTRAMUSCULAR | Status: AC
  Filled 2014-11-01: qty 1

## 2014-11-01 MED ORDER — SODIUM CHLORIDE 0.9 % IR SOLN
Status: DC | PRN
  Administered 2014-11-01: 1000 mL

## 2014-11-01 MED ORDER — EPHEDRINE SULFATE 50 MG/ML IJ SOLN
INTRAMUSCULAR | Status: DC | PRN
  Administered 2014-11-01: 5 mg via INTRAVENOUS
  Administered 2014-11-01: 10 mg via INTRAVENOUS

## 2014-11-01 MED ORDER — PROPOFOL 10 MG/ML IV BOLUS
INTRAVENOUS | Status: DC | PRN
  Administered 2014-11-01: 170 mg via INTRAVENOUS

## 2014-11-01 MED ORDER — METHYLENE BLUE 1 % INJ SOLN
INTRAMUSCULAR | Status: AC
  Filled 2014-11-01: qty 10

## 2014-11-01 MED ORDER — PHENYLEPHRINE 40 MCG/ML (10ML) SYRINGE FOR IV PUSH (FOR BLOOD PRESSURE SUPPORT)
PREFILLED_SYRINGE | INTRAVENOUS | Status: AC
  Filled 2014-11-01: qty 10

## 2014-11-01 MED ORDER — ONDANSETRON HCL 4 MG/2ML IJ SOLN
INTRAMUSCULAR | Status: DC | PRN
Start: 2014-11-01 — End: 2014-11-01
  Administered 2014-11-01 (×2): 4 mg via INTRAVENOUS

## 2014-11-01 MED ORDER — LIDOCAINE HCL (PF) 1 % IJ SOLN
INTRAMUSCULAR | Status: AC
  Filled 2014-11-01: qty 30

## 2014-11-01 MED ORDER — PROMETHAZINE HCL 25 MG/ML IJ SOLN
INTRAMUSCULAR | Status: AC
  Filled 2014-11-01: qty 1

## 2014-11-01 MED ORDER — PROPOFOL 10 MG/ML IV BOLUS
INTRAVENOUS | Status: AC
  Filled 2014-11-01: qty 20

## 2014-11-01 MED ORDER — FENTANYL CITRATE (PF) 100 MCG/2ML IJ SOLN
INTRAMUSCULAR | Status: DC | PRN
  Administered 2014-11-01: 25 ug via INTRAVENOUS
  Administered 2014-11-01: 50 ug via INTRAVENOUS
  Administered 2014-11-01: 100 ug via INTRAVENOUS

## 2014-11-01 MED ORDER — SUGAMMADEX SODIUM 200 MG/2ML IV SOLN
INTRAVENOUS | Status: DC | PRN
  Administered 2014-11-01: 100 mg via INTRAVENOUS

## 2014-11-01 MED ORDER — ROCURONIUM BROMIDE 100 MG/10ML IV SOLN
INTRAVENOUS | Status: DC | PRN
  Administered 2014-11-01: 50 mg via INTRAVENOUS
  Administered 2014-11-01: 20 mg via INTRAVENOUS

## 2014-11-01 MED ORDER — OXYCODONE HCL 5 MG PO TABS
ORAL_TABLET | ORAL | Status: AC
  Filled 2014-11-01: qty 1

## 2014-11-01 MED ORDER — BUPIVACAINE-EPINEPHRINE (PF) 0.25% -1:200000 IJ SOLN
INTRAMUSCULAR | Status: AC
  Filled 2014-11-01: qty 30

## 2014-11-01 MED ORDER — HYDROMORPHONE HCL 1 MG/ML IJ SOLN
0.2500 mg | INTRAMUSCULAR | Status: DC | PRN
  Administered 2014-11-01 (×2): 0.5 mg via INTRAVENOUS

## 2014-11-01 MED ORDER — PHENYLEPHRINE HCL 10 MG/ML IJ SOLN
INTRAMUSCULAR | Status: DC | PRN
  Administered 2014-11-01 (×2): 80 ug via INTRAVENOUS

## 2014-11-01 MED ORDER — PROMETHAZINE HCL 25 MG/ML IJ SOLN
6.2500 mg | INTRAMUSCULAR | Status: DC | PRN
  Administered 2014-11-01: 6.25 mg via INTRAVENOUS

## 2014-11-01 MED ORDER — SUGAMMADEX SODIUM 200 MG/2ML IV SOLN
INTRAVENOUS | Status: AC
  Filled 2014-11-01: qty 2

## 2014-11-01 MED ORDER — ROCURONIUM BROMIDE 50 MG/5ML IV SOLN
INTRAVENOUS | Status: AC
  Filled 2014-11-01: qty 1

## 2014-11-01 MED ORDER — MIDAZOLAM HCL 2 MG/2ML IJ SOLN
2.0000 mg | Freq: Once | INTRAMUSCULAR | Status: AC
  Administered 2014-11-01: 2 mg via INTRAVENOUS

## 2014-11-01 MED ORDER — OXYCODONE-ACETAMINOPHEN 5-325 MG PO TABS: 1.0000 | ORAL_TABLET | ORAL | Status: DC | PRN

## 2014-11-01 MED ORDER — HYDROMORPHONE HCL 1 MG/ML IJ SOLN
INTRAMUSCULAR | Status: DC
Start: 2014-11-01 — End: 2014-11-01
  Filled 2014-11-01: qty 1

## 2014-11-01 MED ORDER — SODIUM CHLORIDE 0.9 % IJ SOLN
INTRAMUSCULAR | Status: AC
  Filled 2014-11-01: qty 10

## 2014-11-01 MED ORDER — ROPIVACAINE HCL 5 MG/ML IJ SOLN
INTRAMUSCULAR | Status: DC | PRN
  Administered 2014-11-01: 30 mg

## 2014-11-01 MED ORDER — NEOSTIGMINE METHYLSULFATE 10 MG/10ML IV SOLN
INTRAVENOUS | Status: AC
  Filled 2014-11-01: qty 3

## 2014-11-01 MED ORDER — OXYCODONE HCL 5 MG PO TABS
5.0000 mg | ORAL_TABLET | ORAL | Status: DC | PRN
  Administered 2014-11-01: 5 mg via ORAL

## 2014-11-01 MED ORDER — SCOPOLAMINE 1 MG/3DAYS TD PT72
1.0000 | MEDICATED_PATCH | TRANSDERMAL | Status: DC
  Administered 2014-11-01: 1.5 mg via TRANSDERMAL

## 2014-11-01 MED ORDER — 0.9 % SODIUM CHLORIDE (POUR BTL) OPTIME
TOPICAL | Status: DC | PRN
  Administered 2014-11-01: 1000 mL

## 2014-11-01 MED ORDER — TECHNETIUM TC 99M SULFUR COLLOID FILTERED
1.0000 | Freq: Once | INTRAVENOUS | Status: AC | PRN
  Administered 2014-11-01: 1 via INTRADERMAL

## 2014-11-01 SURGICAL SUPPLY — 80 items
APPLIER CLIP 9.375 MED OPEN (MISCELLANEOUS) ×3
APPLIER CLIP ROT 10 11.4 M/L (STAPLE) ×3
BINDER BREAST LRG (GAUZE/BANDAGES/DRESSINGS) ×3 IMPLANT
BINDER BREAST XLRG (GAUZE/BANDAGES/DRESSINGS) IMPLANT
BLADE SURG 15 STRL LF DISP TIS (BLADE) ×2 IMPLANT
BLADE SURG 15 STRL SS (BLADE) ×1
BLADE SURG ROTATE 9660 (MISCELLANEOUS) IMPLANT
CANISTER SUCTION 2500CC (MISCELLANEOUS) ×3 IMPLANT
CHLORAPREP W/TINT 26ML (MISCELLANEOUS) ×3 IMPLANT
CLIP APPLIE 9.375 MED OPEN (MISCELLANEOUS) ×2 IMPLANT
CLIP APPLIE ROT 10 11.4 M/L (STAPLE) ×2 IMPLANT
CLIP TI LARGE 6 (CLIP) ×3 IMPLANT
CLIP TI MEDIUM 6 (CLIP) ×6 IMPLANT
COVER MAYO STAND STRL (DRAPES) ×3 IMPLANT
COVER PROBE W GEL 5X96 (DRAPES) ×3 IMPLANT
COVER SURGICAL LIGHT HANDLE (MISCELLANEOUS) ×3 IMPLANT
DECANTER SPIKE VIAL GLASS SM (MISCELLANEOUS) ×3 IMPLANT
DERMABOND ADVANCED (GAUZE/BANDAGES/DRESSINGS) ×2
DERMABOND ADVANCED .7 DNX12 (GAUZE/BANDAGES/DRESSINGS) ×4 IMPLANT
DRAPE CHEST BREAST 15X10 FENES (DRAPES) ×3 IMPLANT
DRAPE PROXIMA HALF (DRAPES) ×3 IMPLANT
DRAPE UNIVERSAL PACK (DRAPES) ×3 IMPLANT
DRAPE UTILITY W/TAPE 26X15 (DRAPES) ×3 IMPLANT
DRAPE UTILITY XL STRL (DRAPES) ×3 IMPLANT
DRAPE WARM FLUID 44X44 (DRAPE) ×3 IMPLANT
ELECT CAUTERY BLADE 6.4 (BLADE) ×3 IMPLANT
ELECT REM PT RETURN 9FT ADLT (ELECTROSURGICAL) ×3
ELECTRODE REM PT RTRN 9FT ADLT (ELECTROSURGICAL) ×2 IMPLANT
FILTER SMOKE EVAC LAPAROSHD (FILTER) IMPLANT
GLOVE BIO SURGEON STRL SZ 6 (GLOVE) ×3 IMPLANT
GLOVE BIO SURGEON STRL SZ7 (GLOVE) ×3 IMPLANT
GLOVE BIO SURGEON STRL SZ7.5 (GLOVE) ×3 IMPLANT
GLOVE BIOGEL PI IND STRL 6.5 (GLOVE) ×2 IMPLANT
GLOVE BIOGEL PI IND STRL 7.0 (GLOVE) ×6 IMPLANT
GLOVE BIOGEL PI IND STRL 7.5 (GLOVE) ×2 IMPLANT
GLOVE BIOGEL PI IND STRL 8 (GLOVE) ×2 IMPLANT
GLOVE BIOGEL PI INDICATOR 6.5 (GLOVE) ×1
GLOVE BIOGEL PI INDICATOR 7.0 (GLOVE) ×3
GLOVE BIOGEL PI INDICATOR 7.5 (GLOVE) ×1
GLOVE BIOGEL PI INDICATOR 8 (GLOVE) ×1
GLOVE SURG SS PI 7.0 STRL IVOR (GLOVE) ×3 IMPLANT
GLOVE SURG SS PI 8.0 STRL IVOR (GLOVE) ×3 IMPLANT
GOWN STRL REUS W/ TWL LRG LVL3 (GOWN DISPOSABLE) ×4 IMPLANT
GOWN STRL REUS W/TWL 2XL LVL3 (GOWN DISPOSABLE) ×3 IMPLANT
GOWN STRL REUS W/TWL LRG LVL3 (GOWN DISPOSABLE) ×2
KIT BASIN OR (CUSTOM PROCEDURE TRAY) ×3 IMPLANT
KIT MARKER MARGIN INK (KITS) ×3 IMPLANT
KIT ROOM TURNOVER OR (KITS) ×3 IMPLANT
LIQUID BAND (GAUZE/BANDAGES/DRESSINGS) ×3 IMPLANT
NDL SAFETY ECLIPSE 18X1.5 (NEEDLE) IMPLANT
NEEDLE HYPO 18GX1.5 SHARP (NEEDLE)
NEEDLE HYPO 25X1 1.5 SAFETY (NEEDLE) ×3 IMPLANT
NS IRRIG 1000ML POUR BTL (IV SOLUTION) ×3 IMPLANT
PACK SURGICAL SETUP 50X90 (CUSTOM PROCEDURE TRAY) ×3 IMPLANT
PACK UNIVERSAL I (CUSTOM PROCEDURE TRAY) ×3 IMPLANT
PAD ARMBOARD 7.5X6 YLW CONV (MISCELLANEOUS) ×3 IMPLANT
PENCIL BUTTON HOLSTER BLD 10FT (ELECTRODE) ×3 IMPLANT
POUCH SPECIMEN RETRIEVAL 10MM (ENDOMECHANICALS) ×3 IMPLANT
SCISSORS LAP 5X35 DISP (ENDOMECHANICALS) ×3 IMPLANT
SET IRRIG TUBING LAPAROSCOPIC (IRRIGATION / IRRIGATOR) ×3 IMPLANT
SLEEVE ENDOPATH XCEL 5M (ENDOMECHANICALS) ×3 IMPLANT
SPECIMEN JAR SMALL (MISCELLANEOUS) ×3 IMPLANT
SPONGE LAP 18X18 X RAY DECT (DISPOSABLE) ×3 IMPLANT
STRIP CLOSURE SKIN 1/2X4 (GAUZE/BANDAGES/DRESSINGS) ×3 IMPLANT
SUT MNCRL AB 4-0 PS2 18 (SUTURE) ×9 IMPLANT
SUT VIC AB 3-0 SH 18 (SUTURE) ×3 IMPLANT
SUT VIC AB 3-0 SH 8-18 (SUTURE) ×3 IMPLANT
SUT VICRYL 0 UR6 27IN ABS (SUTURE) ×3 IMPLANT
SYR BULB 3OZ (MISCELLANEOUS) ×3 IMPLANT
SYR BULB IRRIGATION 50ML (SYRINGE) ×3 IMPLANT
SYR CONTROL 10ML LL (SYRINGE) ×3 IMPLANT
TOWEL OR 17X24 6PK STRL BLUE (TOWEL DISPOSABLE) ×3 IMPLANT
TOWEL OR 17X26 10 PK STRL BLUE (TOWEL DISPOSABLE) ×3 IMPLANT
TRAY LAPAROSCOPIC MC (CUSTOM PROCEDURE TRAY) ×3 IMPLANT
TROCAR XCEL BLUNT TIP 100MML (ENDOMECHANICALS) ×3 IMPLANT
TROCAR XCEL NON-BLD 11X100MML (ENDOMECHANICALS) ×3 IMPLANT
TROCAR XCEL NON-BLD 5MMX100MML (ENDOMECHANICALS) ×3 IMPLANT
TUBE CONNECTING 12X1/4 (SUCTIONS) ×3 IMPLANT
TUBING INSUFFLATION (TUBING) ×3 IMPLANT
YANKAUER SUCT BULB TIP NO VENT (SUCTIONS) ×3 IMPLANT

## 2014-11-01 NOTE — Discharge Instructions (Signed)
[place] Office Phone Number [phone]   POST OP INSTRUCTIONS  Always review your discharge instruction sheet given to you by the facility where your surgery was performed.  IF YOU HAVE DISABILITY OR FAMILY LEAVE FORMS, YOU MUST BRING THEM TO THE OFFICE FOR PROCESSING.  DO NOT GIVE THEM TO YOUR DOCTOR.  1. A prescription for pain medication may be given to you upon discharge.  Take your pain medication as prescribed, if needed.  If narcotic pain medicine is not needed, then you may take acetaminophen (Tylenol) or ibuprofen (Advil) as needed. 2. Take your usually prescribed medications unless otherwise directed 3. If you need a refill on your pain medication, please contact your pharmacy.  They will contact our office to request authorization.  Prescriptions will not be filled after 5pm or on week-ends. 4. You should eat very light the first 24 hours after surgery, such as soup, crackers, pudding, etc.  Resume your normal diet the day after surgery 5. It is common to experience some constipation if taking pain medication after surgery.  Increasing fluid intake and taking a stool softener will usually help or prevent this problem from occurring.  A mild laxative (Milk of Magnesia or Miralax) should be taken according to package directions if there are no bowel movements after 48 hours. 6. You may shower in 48 hours.  The surgical glue will flake off in 2-3 weeks.   7. ACTIVITIES:  No strenuous activity or heavy lifting for 1 week.   a. You may drive when you no longer are taking prescription pain medication, you can comfortably wear a seatbelt, and you can safely maneuver your car and apply brakes. b. RETURN TO WORK:  __________to be determined.  _______________ Dennis Bast should see your doctor in the office for a follow-up appointment approximately three-four weeks after your surgery.    WHEN TO CALL YOUR DOCTOR: 1. Fever over 101.0 2. Nausea and/or vomiting. 3. Extreme swelling  or bruising. 4. Continued bleeding from incision. 5. Increased pain, redness, or drainage from the incision.  The clinic staff is available to answer your questions during regular business hours.  Please dont hesitate to call and ask to speak to one of the nurses for clinical concerns.  If you have a medical emergency, go to the nearest emergency room or call 911.  A surgeon from [facility] is always on call at the hospital.  For further questions, please visit [email or web address]    [facility], [place] [phone]  ABDOMINAL SURGERY: POST OP INSTRUCTIONS  Always review your discharge instruction sheet given to you by the facility where your surgery was performed.  IF YOU HAVE DISABILITY OR FAMILY LEAVE FORMS, YOU MUST BRING THEM TO THE OFFICE FOR PROCESSING.  PLEASE DO NOT GIVE THEM TO YOUR DOCTOR.  8. A prescription for pain medication may be given to you upon discharge.  Take your pain medication as prescribed, if needed.  If narcotic pain medicine is not needed, then you may take acetaminophen (Tylenol) or ibuprofen (Advil) as needed. 9. Take your usually prescribed medications unless otherwise directed. 10. If you need a refill on your pain medication, please contact your pharmacy. They will contact our office to request authorization.  Prescriptions will not be filled after 5pm or on week-ends. 11. You should follow a light diet the first few days after arrival home, such as soup and crackers, pudding, etc.unless your doctor has advised otherwise. A high-fiber, low fat diet  can be resumed as tolerated.   Be sure to include lots of fluids daily. Most patients will experience some swelling and bruising on the chest and neck area.  Ice packs will help.  Swelling and bruising can take several days to resolve 12. Most patients will experience some swelling and bruising in the area of the incision. Ice pack will help. Swelling and bruising can take  several days to resolve..  13. It is common to experience some constipation if taking pain medication after surgery.  Increasing fluid intake and taking a stool softener will usually help or prevent this problem from occurring.  A mild laxative (Milk of Magnesia or Miralax) should be taken according to package directions if there are no bowel movements after 48 hours. 14.  You may have steri-strips (small skin tapes) in place directly over the incision.  These strips should be left on the skin for 10-14 days.  If your surgeon used skin glue on the incision, you may shower in 48 hours.  The glue will flake off over the next 2-3 weeks.  Any sutures or staples will be removed at the office during your follow-up visit. You may find that a light gauze bandage over your incision may keep your staples from being rubbed or pulled. You may shower and replace the bandage daily. 15. ACTIVITIES:  You may resume regular (light) daily activities beginning the next day--such as daily self-care, walking, climbing stairs--gradually increasing activities as tolerated.  You may have sexual intercourse when it is comfortable.  Refrain from any heavy lifting or straining until approved by your doctor. a. You may drive when you no longer are taking prescription pain medication, you can comfortably wear a seatbelt, and you can safely maneuver your car and apply brakes b. Return to Work: __________2-4 weeks depending on how you are doing____________________ 50. You should see your doctor in the office for a follow-up appointment approximately two weeks after your surgery.  Make sure that you call for this appointment within a day or two after you arrive home to insure a convenient appointment time. OTHER INSTRUCTIONS:  _____________________________________________________________ _____________________________________________________________  WHEN TO CALL YOUR DOCTOR: 6. Fever over 101.0 7. Inability to urinate 8. Nausea  and/or vomiting 9. Extreme swelling or bruising 10. Continued bleeding from incision. 11. Increased pain, redness, or drainage from the incision. 12. Difficulty swallowing or breathing 13. Muscle cramping or spasms. 14. Numbness or tingling in hands or feet or around lips.  The clinic staff is available to answer your questions during regular business hours.  Please dont hesitate to call and ask to speak to one of the nurses if you have concerns.  For further questions, please visit [email or web address]

## 2014-11-01 NOTE — Interval H&P Note (Signed)
History and Physical Interval Note:  11/01/2014 7:38 AM  Tracey Mckinney  has presented today for surgery, with the diagnosis of right breast cancer, chronic cholecystitis with calculus  The various methods of treatment have been discussed with the patient and family. After consideration of risks, benefits and other options for treatment, the patient has consented to  Procedure(s): BREAST LUMPECTOMY WITH RADIOACTIVE SEED AND SENTINEL LYMPH NODE BIOPSY (Right) LAPAROSCOPIC CHOLECYSTECTOMY (N/A) as a surgical intervention .  The patient's history has been reviewed, patient examined, no change in status, stable for surgery.  I have reviewed the patient's chart and labs.  Questions were answered to the patient's satisfaction.     [name]

## 2014-11-01 NOTE — Anesthesia Procedure Notes (Addendum)
Anesthesia Regional Block:  Pectoralis block  Pre-Anesthetic Checklist: ,, timeout performed, Correct Patient, Correct Site, Correct Laterality, Correct Procedure, Correct Position, site marked, Risks and benefits discussed,  Surgical consent,  Pre-op evaluation,  At surgeon's request and post-op pain management  Laterality: Right  Prep: chloraprep       Needles:  Injection technique: Single-shot  Needle Type: Echogenic Stimulator Needle     Needle Length: 10cm 10 cm Needle Gauge: 21 and 21 G    Additional Needles:  Procedures: ultrasound guided (picture in chart) Pectoralis block Narrative:  Start time: 11/01/2014 8:36 AM End time: 11/01/2014 8:46 AM Injection made incrementally with aspirations every 5 mL.  Performed by: Personally  Anesthesiologist: Duane Boston   Procedure Name: Intubation Date/Time: 11/01/2014 10:03 AM Performed by: Merdis Delay Pre-anesthesia Checklist: Patient identified, Patient being monitored, Suction available and Emergency Drugs available Patient Re-evaluated:Patient Re-evaluated prior to inductionOxygen Delivery Method: Circle system utilized Preoxygenation: Pre-oxygenation with 100% oxygen Intubation Type: IV induction Ventilation: Mask ventilation without difficulty Laryngoscope Size: Mac and 3 Grade View: Grade I Tube size: 7.5 mm Number of attempts: 1 Airway Equipment and Method: Stylet Placement Confirmation: ETT inserted through vocal cords under direct vision,  breath sounds checked- equal and bilateral,  positive ETCO2 and CO2 detector Secured at: 22 cm Tube secured with: Tape Dental Injury: Teeth and Oropharynx as per pre-operative assessment

## 2014-11-01 NOTE — Op Note (Signed)
Right Breast Radioactive seed localized lumpectomy and sentinel lymph node biopsy, laparoscopic cholecystectomy  Indications: This patient presents with history of right breast cancer, [id number]  Pre-operative Diagnosis: right breast cancer, chronic calculous cholecystitis  Post-operative Diagnosis: Same  Surgeon: [name]   Anesthesia: General endotracheal anesthesia  ASA Class: 2  Procedure Details  The patient was seen in the Holding Room. The risks, benefits, complications, treatment options, and expected outcomes were discussed with the patient. The possibilities of bleeding, infection, the need for additional procedures, failure to diagnose a condition, and creating a complication requiring transfusion or operation were discussed with the patient. The patient concurred with the proposed plan, giving informed consent.  The site of surgery properly noted/marked. The patient was taken to Operating Room # 8, identified, and the procedure verified as Right Breast Lumpectomy with sentinel lymph node biopsy and laparoscopic cholecystectomy. A Time Out was held and the above information confirmed.  The right breast, chest, and abdomen were prepped and draped in standard fashion. The lumpectomy was performed by creating an transverse incision over the upper outer quadrant of the breast over the previously placed radioactive seed.  Dissection was carried down to around the point of maximum signal intensity. The cautery was used to perform the dissection.  Hemostasis was achieved with cautery. The edges of the cavity were marked with large clips, with one each medial, lateral, inferior and superior, and two clips posteriorly.   The specimen was inked with the margin marker paint kit.    Specimen radiography confirmed inclusion of the mammographic lesion, the clip, and the seed.  The background signal in the breast was zero.  The wound was irrigated and closed with 3-0 vicryl in layers and 4-0 monocryl  subcuticular suture.    Using a hand-held gamma probe, right axillary sentinel nodes were identified transcutaneously.  An oblique incision was created below the axillary hairline.  Dissection was carried through the clavipectoral fascia.  Two level 2 axillary sentinel nodes were removed.  Counts per second were 250 and 75.     The background count was 5 cps.  The wound was irrigated.  Hemostasis was achieved with cautery.  The axillary incision was closed with a 3-0 vicryl deep dermal interrupted sutures and a 4-0 monocryl subcuticular closure.  Local anesthetic agent was injected into the skin near the umbilicus and an incision made. We dissected down to the abdominal fascia with blunt dissection.  The fascia was incised vertically and we entered the peritoneal cavity bluntly.  A pursestring suture of 0-Vicryl was placed around the fascial opening.  The Hasson cannula was inserted and secured with the stay suture.  Pneumoperitoneum was then created with CO2 and tolerated well without any adverse changes in the patient's vital signs. An 11-mm port was placed in the subxiphoid position.  Two 5-mm ports were placed in the right upper quadrant. All skin incisions were infiltrated with a local anesthetic agent before making the incision and placing the trocars.   We positioned the patient in reverse Trendelenburg, tilted slightly to the patient's left.  The gallbladder was identified, the fundus grasped and retracted cephalad. Adhesions were lysed bluntly and with the electrocautery where indicated, taking care not to injure any adjacent organs or viscus. The infundibulum was grasped and retracted laterally, exposing the peritoneum overlying the triangle of Calot. The cystic artery was very anterior.  It was iidentified, dissected free, ligated with clips and divided. A critical view of the cystic duct was obtained.  The cystic  duct was clearly identified and bluntly dissected circumferentially. It was very  small.  The cystic duct was ligated with a clip distally and three clips proximally.   The gallbladder was dissected from the liver bed in retrograde fashion with the electrocautery. The gallbladder was removed and placed in an Endocatch bag.  The gallbladder and Endocatch bag were then removed through the umbilical port site.  The liver bed was irrigated and inspected. Hemostasis was achieved with the electrocautery. Copious irrigation was utilized and was repeatedly aspirated until clear.    We again inspected the right upper quadrant for hemostasis.  Pneumoperitoneum was released as we removed the trocars.   The pursestring suture was used to close the umbilical fascia.  4-0 Monocryl was used to close the skin.   The skin was cleaned and dry, and Dermabond was applied. The patient was then extubated and brought to the recovery room in stable condition. Instrument, sponge, and needle counts were correct at closure and at the conclusion of the case.      Sterile dressings were applied. At the end of the operation, all sponge, instrument, and needle counts were correct.  Findings: grossly clear surgical margins and no adenopathy, cps 250, 75, gallbladder with mild chronic inflammation.  Anterior and lateral margins are skin.  Posterior margin is pectoralis.    Estimated Blood Loss:  min         Specimens: Right  breast lumpectomy and two right axillary sentinel lymph nodes.  Gallbladder           Complications:  None; patient tolerated the procedure well.         Disposition: PACU - hemodynamically stable.         Condition: stable

## 2014-11-01 NOTE — Anesthesia Postprocedure Evaluation (Signed)
Anesthesia Post Note  Patient: Tracey Mckinney  Procedure(s) Performed: Procedure(s) (LRB): BREAST LUMPECTOMY WITH RADIOACTIVE SEED AND SENTINEL LYMPH NODE BIOPSY (Right) LAPAROSCOPIC CHOLECYSTECTOMY (N/A)  Anesthesia type: general  Patient location: PACU  Post pain: Pain level controlled  Post assessment: Patient's Cardiovascular Status Stable  Last Vitals:  Filed Vitals:   11/01/14 1200  BP: 139/63  Pulse: 57  Temp:   Resp: 12    Post vital signs: Reviewed and stable  Level of consciousness: sedated  Complications: No apparent anesthesia complications

## 2014-11-01 NOTE — H&P (Signed)
Tracey Mckinney 10/19/2014 12:54 PM Location: [facility] Patient #: [id number] DOB: [birth year] Undefined / Language: Tracey Mckinney / Race: Undefined Female  History of Present Illness Tracey Mckinney; 10/19/2014 1:02 PM) The patient is a 54 year old female who presents with breast cancer. Previous history [The patient is being seen for a consultation for Mckinney I ( T1 and N0 ) invasive ductal carcinoma of the right breast. Tumor markers include estrogen receptor positive, progesterone receptor positive and HER-2/neu negative. No associated conditions are noted. Initial presentation was 4 week(s) ago for an abnormal mammogram. Current diagnosis was determined by mammography and right breast ultrasound. Symptoms include fatigue.]  The patient is seen in follow-up for her right breast cancer. She has thought significantly about her treatment options and has opted for a lumpectomy. She was originally thinking about getting a mastectomy versus a bilateral mastectomy. However once going through all the options in her mind, she would like to pursue breast conservation therapy. She decided to transfer her care from Dr. Brantley Mckinney to myself because of her ease of interaction with female physicians. She has not yet seen genetics.  Additional reasons for visit:  Gall stones is described as the following: The patient also complains of abdominal pain. This is moderate and occurs in the epigastric region as well as the right upper quadrant. She experiences this pain approximately 15 minutes after she eats. She has cut out almost all fats out of her diet. This has been a big problem for her as she eats a fairly restricted diet anyway due to food intolerance to add sugars and gluten. She has had less pain and fewer episodes of nausea since keeping to an extremely restricted diet, however she has been losing weight on this diet. She had a big workup around 6-9 months ago including colonoscopy and endoscopy that was  negative. Originally, she did not have any evidence of gallstones on ultrasound. However a more recent ultrasound has shown gallstones.  Review of Systems Tracey Mckinney; 10/19/2014 1:00 PM) All other systems negative   Physical Exam Tracey Mckinney; 10/19/2014 1:03 PM) General Mental Status-Alert. General Appearance-Consistent with stated age. Hydration-Well hydrated. Voice-Normal.  Chest and Lung Exam Chest and lung exam reveals -quiet, even and easy respiratory effort with no use of accessory muscles. Inspection Chest Wall - Normal. Back - normal.  Cardiovascular Cardiovascular examination reveals -normal pedal pulses bilaterally. Note: regular rate and rhythm  Abdomen Inspection-Inspection Normal. Palpation/Percussion Palpation and Percussion of the abdomen reveal - Soft, Non Tender, No Rebound tenderness, No Rigidity (guarding) and No hepatosplenomegaly. Note: The abdomen is soft and nondistended. There is tenderness in the right upper quadrant with deep palpation. No evidence of hernia.     Assessment & Plan Tracey Mckinney; 10/19/2014 1:10 PM) PRIMARY CANCER OF UPPER OUTER QUADRANT OF RIGHT FEMALE BREAST (174.4  C50.411) Impression: We will plan breast conservation for this patient. I discussed that with her breast size that she will likely need some sort of plastic surgery assistance to maintain breast symmetry. We discussed the pros and cons of lumpectomy versus mastectomy. I do think that she will notice a defect in this area because of her breast size, however I think that it is a much smaller surgery and that she will still be able to have a sensate nipple.  She has an appointment with plastic surgery next week. I discussed that in the absence of mastectomy, that they would delay any reconstruction options until after radiation and a period  of time for healing. I discussed that that is usually 6-12 months after completion of treatment.  She will get an  Oncotype sent on her surgical specimen. I will send off genetic testing on Tuesday and she is going to get her genetics appointment rescheduled. She will definitely see them, but this way we will not have to wait as long for testing. I advised her that if she does have a BRCA1 or 2 defect and desires mastectomy, that we would need to reschedule due to time concerns.  The surgical procedure was described to the patient. I discussed the incision type and location and that we would need radiology involved on with a wire or seed marker and/or sentinel node.  The risks and benefits of the procedure were described to the patient and she wishes to proceed.  We discussed the risks bleeding, infection, damage to other structures, need for further procedures/surgeries. We discussed the risk of seroma. The patient was advised if the area in the breast in cancer, we may need to go back to surgery for additional tissue to obtain negative margins or for a lymph node biopsy. The patient was advised that these are the most common complications, but that others can occur as well. They were advised against taking aspirin or other anti-inflammatory agents/blood thinners the week before surgery. I reviewed the process of seed placement. CHRONIC CHOLECYSTITIS WITH CALCULUS (574.10  K80.10) Impression: I think the patient will benefit from cholecystectomy. She has classic symptoms of biliary colic with postprandial pain. She has lost weight. I think she also has chronic cholecystitis due to the fact that she is still tender in her right upper quadrant.  The surgical procedure was described to the patient in detail. The patient was given educational material. I discussed the incision type and location, the location of the gallbladder, the anatomy of the bile ducts and arteries, and the typical progression of surgery. I discussed the possibility of converting to an open operation. I advised of the risks of bleeding, infection,  damage to other structures (such as the bile duct, intestine or liver), bile leak, need for other procedures or surgeries, and post op diarrhea/constipation. We discussed the risk of blood clot. We discussed the recovery period and post operative restrictions. The patient was advised against taking blood thinners the week before surgery. 1 hour was spent wtih total visit, with >50% spent in counseling. Current Plans  Schedule for Surgery Pt Education - [facility] breast cancer surgery: discussed with patient and provided information. Pt Education - Laparoscopic Cholecystectomy: gallbladder   Signed by Tracey Klein, Mckinney (10/19/2014 1:13 PM)

## 2014-11-01 NOTE — Transfer of Care (Signed)
Immediate Anesthesia Transfer of Care Note  Patient: International aid/development worker  Procedure(s) Performed: Procedure(s): BREAST LUMPECTOMY WITH RADIOACTIVE SEED AND SENTINEL LYMPH NODE BIOPSY (Right) LAPAROSCOPIC CHOLECYSTECTOMY (N/A)  Patient Location: PACU  Anesthesia Type:General  Level of Consciousness: sedated  Airway & Oxygen Therapy: Patient Spontanous Breathing and Patient connected to nasal cannula oxygen  Post-op Assessment: Report given to RN and Post -op Vital signs reviewed and stable  Post vital signs: Reviewed and stable  Last Vitals:  Filed Vitals:   11/01/14 0859  BP: 119/71  Pulse: 66  Temp:   Resp: 16    Complications: No apparent anesthesia complications

## 2014-11-01 NOTE — Anesthesia Preprocedure Evaluation (Signed)
Anesthesia Evaluation  Patient identified by MRN, date of birth, ID band Patient awake    Reviewed: Allergy & Precautions, NPO status , Patient's Chart, lab work & pertinent test results  History of Anesthesia Complications (+) PONV  Airway Mallampati: II  TM Distance: >3 FB Neck ROM: Full    Dental  (+) Teeth Intact, Dental Advisory Given   Pulmonary asthma ,    Pulmonary exam normal       Cardiovascular negative cardio ROS Normal cardiovascular exam    Neuro/Psych PSYCHIATRIC DISORDERS Depression negative neurological ROS     GI/Hepatic Neg liver ROS,   Endo/Other  Hypothyroidism   Renal/GU negative Renal ROS     Musculoskeletal   Abdominal   Peds  Hematology   Anesthesia Other Findings   Reproductive/Obstetrics                             Anesthesia Physical Anesthesia Plan  ASA: II  Anesthesia Plan: General   Post-op Pain Management:    Induction: Intravenous  Airway Management Planned: Oral ETT  Additional Equipment:   Intra-op Plan:   Post-operative Plan: Extubation in OR  Informed Consent: I have reviewed the patients History and Physical, chart, labs and discussed the procedure including the risks, benefits and alternatives for the proposed anesthesia with the patient or authorized representative who has indicated his/her understanding and acceptance.   Dental advisory given  Plan Discussed with: CRNA, Anesthesiologist and Surgeon  Anesthesia Plan Comments:         Anesthesia Quick Evaluation

## 2014-11-02 ENCOUNTER — Encounter (HOSPITAL_COMMUNITY): Payer: Self-pay | Admitting: General Surgery

## 2014-11-06 NOTE — Progress Notes (Signed)
Quick Note:  Please let patient know margins and lymph nodes are negative!!! ______

## 2014-11-07 NOTE — Assessment & Plan Note (Signed)
Right lumpectomy 11/01/2014: IDC grade 1, 1.8 cm, associated intermediate grade DCIS, LCIS, posterior margin less than 0.2 cm for IDC plus DCIS, 0/2 lymph nodes; ER 95%, PR 50%, HER-2 negative ratio 1.3, Ki-67 5%, T1 cN0 stage IA  Pathology counseling: I discussed the pathology report in great detail and provided the patient copy of this report. I briefly discussed with her about margins being negative. Lymph nodes were also negative.  Recommendation: 1. Oncotype DX testing to determine if she would benefit from chemotherapy 2. Followed by adjuvant radiation 3. Followed by antiestrogen therapy  Return to clinic based on Oncotype DX test result. If he does not need chemotherapy then we will make an appointment with radiation oncology.

## 2014-11-08 ENCOUNTER — Ambulatory Visit (HOSPITAL_BASED_OUTPATIENT_CLINIC_OR_DEPARTMENT_OTHER): Payer: Managed Care, Other (non HMO) | Admitting: Genetic Counselor

## 2014-11-08 ENCOUNTER — Encounter: Payer: Self-pay | Admitting: Genetic Counselor

## 2014-11-08 ENCOUNTER — Ambulatory Visit (HOSPITAL_BASED_OUTPATIENT_CLINIC_OR_DEPARTMENT_OTHER): Payer: Managed Care, Other (non HMO) | Admitting: Hematology and Oncology

## 2014-11-08 ENCOUNTER — Ambulatory Visit: Payer: Managed Care, Other (non HMO)

## 2014-11-08 ENCOUNTER — Encounter: Payer: Self-pay | Admitting: *Deleted

## 2014-11-08 ENCOUNTER — Encounter: Payer: Self-pay | Admitting: Hematology and Oncology

## 2014-11-08 VITALS — BP 143/87 | HR 77 | Temp 98.2°F | Resp 18 | Ht 66.0 in | Wt 127.6 lb

## 2014-11-08 DIAGNOSIS — Z808 Family history of malignant neoplasm of other organs or systems: Secondary | ICD-10-CM

## 2014-11-08 DIAGNOSIS — Z809 Family history of malignant neoplasm, unspecified: Secondary | ICD-10-CM

## 2014-11-08 DIAGNOSIS — C50411 Malignant neoplasm of upper-outer quadrant of right female breast: Secondary | ICD-10-CM

## 2014-11-08 DIAGNOSIS — Z803 Family history of malignant neoplasm of breast: Secondary | ICD-10-CM

## 2014-11-08 DIAGNOSIS — C50911 Malignant neoplasm of unspecified site of right female breast: Secondary | ICD-10-CM

## 2014-11-08 NOTE — Progress Notes (Signed)
Met with pt post op. Relate doing well. Denies needs at this time. Encourage pt to call with questions.or concerns. Received verbal understanding. Received orders for oncotype testing from Dr. Lindi Adie. Requisition sent to pathology. Received by Maudie Mercury. PAC sent to [facility]

## 2014-11-08 NOTE — Progress Notes (Signed)
REFERRING PROVIDER: Stark Mckinney  OTHER PROVIDERS: Tracey Mckinney  PRIMARY PROVIDER:  Mylinda Latina, MD  PRIMARY REASON FOR VISIT:  1. Breast cancer, right   2. Family history of breast cancer in mother   74. Family history of breast cancer in female   71. Family history of cancer in maternal grandfather      HISTORY OF PRESENT ILLNESS:   Tracey Mckinney, a 54 y.o. female, was seen for a [facility] cancer genetics consultation at the request of Dr. Tye Savoy due to a personal history of breast cancer and family history of breast and unknown cancers.  Tracey Mckinney presents to clinic today with her husband to discuss the possibility of a hereditary predisposition to cancer, genetic testing, and to further clarify her future cancer risks, as well as potential cancer risks for family members.   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 Hormone receptor status was ER/PR+, Her2-.  This was treated with right lumpectomy to be followed by treatment pending oncotype testing. She also had a cholecystectomy at the time of her lumpectomy surgery.  CANCER HISTORY:    Breast cancer of upper-outer quadrant of right female breast   09/28/2014 Initial Diagnosis Right breast needle biopsy 10:00 position: 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%, PR 50%, Ki-67 5%, HER-2 negative, grade 1; right breast biopsy 10:00 2 cm from nipple: Fibrocystic changes with usual ductal hyperplasia   09/28/2014 Mammogram Mammogram and ultrasound suspicious irregular mass at 10:00 position right breast 1.8 cm in addition 3 small nodules 0.5, 0.6, 0.5 cm biopsy proven to be benign fibrocystic changes   11/01/2014 Surgery Right lumpectomy: IDC grade 1, 1.8 cm, associated intermediate grade DCIS, LCIS, posterior margin less than 0.2 cm for IDC plus DCIS, 0/2 lymph nodes; ER 95%, PR 50%, HER-2 negative ratio 1.3, Ki-67 5%, T1 cN0 stage IA     HORMONAL  RISK FACTORS:  Menarche was at age 31.  First live birth at age 65.  OCP use for approximately 5 years.  Ovaries intact: yes.  Hysterectomy: no.  Menopausal status: postmenopausal.  HRT use: progesterone for approx 6 years; testosterone approx. 1 year  years. Colonoscopy: yes; normal. Mammogram within the last year: sometimes received mammograms every 1 1/2 - 2 years instead of annually. Number of breast biopsies: 2. Up to date with pelvic exams:  yes. Any excessive radiation exposure in the past:  no  Past Medical History  Diagnosis Date  . Chronic fatigue   . Thyroid disease   . Gallstones   . Arthritis   . PONV (postoperative nausea and vomiting)   . Asthma     last flareup about a month ago--usually when it rains  . Hypothyroidism   . Depression   . ADD (attention deficit disorder)   . Wheat allergy     yeast & dairy  . Breast cancer 2016    IDC+DCIS+LCIS of UOQ of Right breast; ER/PR+, Her2-    Past Surgical History  Procedure Laterality Date  . Refractive surgery    . Eye surgery    . Breast surgery      right breast bx done in june 2-16  . Breast lumpectomy with radioactive seed and sentinel lymph node biopsy Right 11/01/2014    Procedure: BREAST LUMPECTOMY WITH RADIOACTIVE SEED AND SENTINEL LYMPH NODE BIOPSY;  Surgeon: Tracey Klein, MD;  Location: [facility];  Service: General;  Laterality: Right;  .  Cholecystectomy N/A 11/01/2014    Procedure: LAPAROSCOPIC CHOLECYSTECTOMY;  Surgeon: Tracey Klein, MD;  Location: [facility];  Service: General;  Laterality: N/A;    History   Social History  . Marital Status: Married    Spouse Name: N/A  . Number of Children: N/A  . Years of Education: N/A   Social History Main Topics  . Smoking status: Never Smoker   . Smokeless tobacco: Not on file  . Alcohol Use: No  . Drug Use: No  . Sexual Activity: Not on file   Other Topics Concern  . Not on file   Social History Narrative     FAMILY HISTORY:  We obtained a detailed,  4-generation family history.  Significant diagnoses are listed below: Family History  Problem Relation Age of Onset  . Breast cancer Mother     Tracey Mckinney has two daughters, ages 54 and 61.  She has two full brothers and one full sister--all also in their 52s, none of whom have had cancer and she was unsure whether these siblings had previously had abnormal colonoscopies.  Her sister, age 43, had one cyst removed from her ovaries previously, but has not had an oophorectomy.  She has multiple nieces and nephews ages 12-29, who are all alive and cancer-free.    Tracey Mckinney father is currently 70 and cancer-free.  He is an only child.  Tracey Mckinney paternal grandfather died in his early 28s and her paternal grandmother died at the age of 89--neither had a history of cancer.  Tracey Mckinney is unaware of any further paternal relatives with cancer.   Tracey Mckinney mother was diagnosed with breast cancer at 47; this metastasized to her liver and brain and she passed away at 17.  Tracey Mckinney mother had one full brother and sister both of whom died in their late 60s.  Her brother was cancer free, as were his four children.  Her sister was diagnosed with breast cancer in her 81s.  This aunt had one sone and one daughter; both are currently in their late 50s/early 36s and have not had cancer.  Tracey Mckinney maternal grandfather passed way from an unknown cancer in his late 58s.  She thinks this cancer may have been colon or bladder or a different type of cancer in that area, but she is not certain.  She has no information regarding the siblings or parents of her maternal grandfather.  Her maternal grandmother died in her late 62s from heart problems.    Patient's maternal ancestors are of Caucasian descent, and paternal ancestors are of Caucasian/European descent. There is no reported Ashkenazi Jewish ancestry. There is no known consanguinity.  GENETIC COUNSELING ASSESSMENT: Tracey Mckinney is a 54 y.o. female with a  personal history of breast cancer and family history of breast and unknown cancer which is somewhat suggestive of a hereditary cancer syndrome and predisposition to cancer. We, therefore, discussed and recommended the following at today's visit.   DISCUSSION: We reviewed the characteristics, features and inheritance patterns of hereditary cancer syndromes, particularly those caused by changes in the BRCA1/2 and Lynch syndrome genes. We also discussed genetic testing, including the appropriate family members to test, the process of testing, insurance coverage and turn-around-time for results. We discussed the implications of a negative, positive and/or variant of uncertain significant result. Tracey Mckinney previously saw Dr. Barry Dienes on October 23, 2014 at which time she initially chose to have genetic testing and had her blood drawn.  This blood sample was sent  to [facility] ([place], [place]) for [facility] testing.  After further discussion today, however, Tracey Mckinney would like to have testing that would consider additional genes associated with breast and related cancer risks; this type of testing can be offered through a panel.  We discussed that [facility] generally do not provide panel testing for patients who have [employer].  Since Tracey Mckinney would like to have more comprehensive testing we will continue to hold her sample at [facility] and will send a new blood sample to another lab that does provide panel testing for patients with [employer].  Thus, we recommended Tracey Mckinney pursue genetic testing for the 20-gene Breast/Ovarian Cancer Panel through [facility] Hope Pigeon, MD), pending insurance coverage.  If for some reason Tracey Mckinney would have a large out-of-pocket cost we may consider different testing options and will have samples on file with both labs for further consideration.  Based on Tracey Mckinney's personal and family history of cancer, she meets medical criteria for  genetic testing. Despite that she meets criteria, she may still have an out of pocket cost. We discussed that if her out of pocket cost for testing is over $100, the laboratory will call and confirm whether she wants to proceed with testing.  If the out of pocket cost of testing is less than $100 she will be billed by the genetic testing laboratory.   PLAN: After considering the risks, benefits, and limitations, Tracey Mckinney  provided informed consent to pursue genetic testing and the blood sample was sent to [facility] for analysis of the 20-gene Breast/Ovarian Cancer Panel test.  The Breast/Ovarian gene panel offered by [facility] includes sequencing and deletion/duplication analysis for the following 19 genes:  ATM, BARD1, BRCA1, BRCA2, BRIP1, CDH1, CHEK2, FANCC, MLH1, MSH2, MSH6, NBN, PALB2, PMS2, PTEN, RAD51C, RAD51D, TP53, and XRCC2.  This panel also includes deletion/duplication analysis (without sequencing) for one gene, EPCAM.   Results should be available within approximately 2-3 weeks' time, at which point they will be disclosed by telephone to Ms. Tramontana, as will any additional recommendations warranted by these results. Tracey Mckinney will receive a summary of her genetic counseling visit and a copy of her results once available. This information will also be available in Epic. We encouraged Ms. Mcswain to remain in contact with cancer genetics annually so that we can continuously update the family history and inform her of any changes in cancer genetics and testing that may be of benefit for her family. Ms. Hipp questions were answered to her satisfaction today. Our contact information was provided should additional questions or concerns arise.  Thank you for the referral and allowing [place] to share in the care of your patient.   Jeanine Luz, MS Genetic Counselor [email or web address] Phone: [phone]  The patient was seen for a total of 45 minutes in face-to-face genetic  counseling.  This patient was discussed with Drs. Magrinat, Lindi Adie and/or Burr Medico who agrees with the above.    _______________________________________________________________________ For Office Staff:  Number of people involved in session: 2 Was an Intern/ student involved with case: no

## 2014-11-08 NOTE — Progress Notes (Signed)
Patient Care Team: Arnetha Gula, MD as PCP - General (Family Medicine) Erroll Luna, MD as Consulting Physician (General Surgery) Nicholas Lose, MD as Consulting Physician (Hematology and Oncology) Gery Pray, MD as Consulting Physician (Radiation Oncology) Mauro Kaufmann, RN as Registered Nurse Rockwell Germany, RN as Registered Nurse Holley Bouche, NP as Nurse Practitioner (Nurse Practitioner)  DIAGNOSIS: Breast cancer of upper-outer quadrant of right female breast   Staging form: Breast, AJCC 7th Edition     Clinical stage from 10/10/2014: Stage IA (T1c, N0, M0) - Unsigned   SUMMARY OF ONCOLOGIC HISTORY:   Breast cancer of upper-outer quadrant of right female breast   09/28/2014 Initial Diagnosis Right breast needle biopsy 10:00 position: 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%, 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%, 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%, HER-2 negative, grade 1; right breast biopsy 10:00 2 cm from nipple: Fibrocystic changes with usual ductal hyperplasia   09/28/2014 Mammogram Mammogram and ultrasound suspicious irregular mass at 10:00 position right breast 1.8 cm in addition 3 small nodules 0.5, 0.6, 0.5 cm biopsy proven to be benign fibrocystic changes   11/01/2014 Surgery Right lumpectomy: IDC grade 1, 1.8 cm, associated intermediate grade DCIS, LCIS, posterior margin less than 0.2 cm for IDC plus DCIS, 0/2 lymph nodes; ER 95%, PR 50%, HER-2 negative ratio 1.3, Ki-67 5%, T1 cN0 stage IA    CHIEF COMPLIANT: F/U after recent surgery  INTERVAL HISTORY: Tracey Mckinney is a 54 year old with above-mentioned history of right breast invasive ductal carcinoma she underwent lumpectomy and is here today to discuss the pathology report. She is doing much better in terms of the breast and axilla but she also had a cholecystectomy in his hurting her in the abdomen. She is accompanied by her husband today.  REVIEW OF SYSTEMS:   Constitutional: Denies fevers, chills or abnormal weight loss Eyes: Denies blurriness of vision Ears,  nose, mouth, throat, and face: Denies mucositis or sore throat Respiratory: Denies cough, dyspnea or wheezes Cardiovascular: Denies palpitation, chest discomfort or lower extremity swelling Gastrointestinal:   abdominal discomfort from recent cholecystectomy Skin: Denies abnormal skin rashes Lymphatics: Denies new lymphadenopathy or easy bruising Neurological:Denies numbness, tingling or new weaknesses Behavioral/Psych: Mood is stable, no new changes  All other systems were reviewed with the patient and are negative.  I have reviewed the past medical history, past surgical history, social history and family history with the patient and they are unchanged from previous note.  ALLERGIES:  is allergic to sulfa antibiotics.  MEDICATIONS:  Current Outpatient Prescriptions  Medication Sig Dispense Refill  . Cholecalciferol (VITAMIN D3) 5000 UNITS CAPS Take 1 capsule by mouth daily.     . Fenugreek 610 MG CAPS Take 1 capsule by mouth daily.    Marland Kitchen glucosamine-chondroitin 500-400 MG tablet Take 1 tablet by mouth daily.     . L-TYROSINE PO Take 250 mg elemental calcium/kg/hr by mouth daily.    Marland Kitchen levalbuterol (XOPENEX HFA) 45 MCG/ACT inhaler Inhale into the lungs every 4 (four) hours as needed for wheezing.    . Magnesium 500 MG CAPS Take 1 capsule by mouth daily.    . metroNIDAZOLE (METROCREAM) 0.75 % cream Apply 1 application topically daily as needed.     . Multiple Vitamins-Minerals (MULTIVITAMIN WITH MINERALS) tablet Take 1 tablet by mouth daily.    Marland Kitchen OVER THE COUNTER MEDICATION Take 100 mg by mouth daily. 5 HTP    . OVER THE COUNTER MEDICATION Take 400 mg by mouth daily. SAMe    . OVER THE COUNTER MEDICATION  Take 500 mg by mouth daily. methionine    . Probiotic Product (ACIDOPHILUS/GOAT MILK) CAPS Take 1 capsule by mouth daily.     . Thyroid (NATURE-THROID PO) Take 16 mg by mouth daily. 1/4 grain = 16.2 mg.     No current facility-administered medications for this visit.    PHYSICAL  EXAMINATION: ECOG PERFORMANCE STATUS: 1 - Symptomatic but completely ambulatory  Filed Vitals:   11/08/14 0938  BP: 143/87  Pulse: 77  Temp: 98.2 F (36.8 C)  Resp: 18   Filed Weights   11/08/14 0938  Weight: 127 lb 9.6 oz (57.879 kg)    GENERAL:alert, no distress and comfortable SKIN: skin color, texture, turgor are normal, no rashes or significant lesions EYES: normal, Conjunctiva are pink and non-injected, sclera clear OROPHARYNX:no exudate, no erythema and lips, buccal mucosa, and tongue normal  NECK: supple, thyroid normal size, non-tender, without nodularity LYMPH:  no palpable lymphadenopathy in the cervical, axillary or inguinal LUNGS: clear to auscultation and percussion with normal breathing effort HEART: regular rate & rhythm and no murmurs and no lower extremity edema ABDOM: recent surgery  Musculoskeletal:no cyanosis of digits and no clubbing  NEURO: alert & oriented x 3 with fluent speech, no focal motor/sensory deficits  LABORATORY DATA:  I have reviewed the data as listed   Chemistry      Component Value Date/Time   NA 143 10/10/2014 1251   K 4.0 10/10/2014 1251   CO2 31* 10/10/2014 1251   BUN 14.7 10/10/2014 1251   CREATININE 1.0 10/10/2014 1251      Component Value Date/Time   CALCIUM 10.1 10/10/2014 1251   ALKPHOS 71 10/10/2014 1251   AST 18 10/10/2014 1251   ALT 9 10/10/2014 1251   BILITOT 0.43 10/10/2014 1251       Lab Results  Component Value Date   WBC 5.5 10/31/2014   HGB 13.2 10/31/2014   HCT 38.8 10/31/2014   MCV 86.0 10/31/2014   PLT 176 10/31/2014   NEUTROABS 4.7 10/10/2014   ASSESSMENT & PLAN:  Breast cancer of upper-outer quadrant of right female breast Right lumpectomy 11/01/2014: IDC grade 1, 1.8 cm, associated intermediate grade DCIS, LCIS, posterior margin less than 0.2 cm for IDC plus DCIS, 0/2 lymph nodes; ER 95%, PR 50%, HER-2 negative ratio 1.3, Ki-67 5%, T1 cN0 stage IA  Pathology counseling: I discussed the  pathology report in great detail and provided the patient copy of this report. I briefly discussed with her about margins being negative. Lymph nodes were also negative.  Recommendation: 1. Oncotype DX testing to determine if she would benefit from chemotherapy 2. Followed by adjuvant radiation 3. Followed by antiestrogen therapy  Return to clinic based on Oncotype DX test result. If she does not need chemotherapy then we will make an appointment with radiation oncology. We will discuss her case at tumor board and make a final plan. I believe that her margins are adequate and do not need reexcision.  No orders of the defined types were placed in this encounter.   The patient has a good understanding of the overall plan. she agrees with it. she will call with any problems that may develop before the next visit here.   Rulon Eisenmenger, MD

## 2014-11-12 ENCOUNTER — Telehealth: Payer: Self-pay | Admitting: Hematology and Oncology

## 2014-11-12 ENCOUNTER — Other Ambulatory Visit: Payer: Self-pay

## 2014-11-12 DIAGNOSIS — Z780 Asymptomatic menopausal state: Secondary | ICD-10-CM

## 2014-11-12 DIAGNOSIS — C50411 Malignant neoplasm of upper-outer quadrant of right female breast: Secondary | ICD-10-CM

## 2014-11-12 NOTE — Telephone Encounter (Signed)
Confirmed appointment for bone density for 08/10

## 2014-11-15 ENCOUNTER — Encounter (HOSPITAL_COMMUNITY): Payer: Self-pay

## 2014-11-16 ENCOUNTER — Encounter: Payer: Self-pay | Admitting: *Deleted

## 2014-11-16 NOTE — Progress Notes (Signed)
Received oncotype score of 23/15%. Original to HIM for scanning. Copy given to Dr. Lindi Adie.

## 2014-11-21 ENCOUNTER — Telehealth: Payer: Self-pay | Admitting: Genetic Counselor

## 2014-11-21 ENCOUNTER — Ambulatory Visit: Payer: Self-pay | Admitting: Genetic Counselor

## 2014-11-21 DIAGNOSIS — Z803 Family history of malignant neoplasm of breast: Secondary | ICD-10-CM | POA: Insufficient documentation

## 2014-11-21 DIAGNOSIS — C50411 Malignant neoplasm of upper-outer quadrant of right female breast: Secondary | ICD-10-CM

## 2014-11-21 DIAGNOSIS — Z1379 Encounter for other screening for genetic and chromosomal anomalies: Secondary | ICD-10-CM

## 2014-11-21 NOTE — Progress Notes (Signed)
GENETIC TEST RESULTS  HPI: [name] was previously seen in the [place] clinic with her husband due to a personal and family history of breast cancer and concerns regarding a hereditary predisposition to cancer. Please refer to our prior cancer genetics clinic note from November 08, 2014 for more information regarding [name]'s medical, social and family histories, and our assessment and recommendations, at the time. [name] recent genetic test results were disclosed to her, as were recommendations warranted by these results. These results and recommendations are discussed in more detail below.  GENETIC TEST RESULTS: At the time of [name]'s visit on 11/08/14, we recommended she pursue genetic testing of the 20-gene Breast/Ovarian Cancer Panel. The Breast/Ovarian gene panel offered by [facility] Hope Pigeon, MD) includes sequencing and deletion/duplication analysis for the following 19 genes:  ATM, BARD1, BRCA1, BRCA2, BRIP1, CDH1, CHEK2, FANCC, MLH1, MSH2, MSH6, NBN, PALB2, PMS2, PTEN, RAD51C, RAD51D, TP53, and XRCC2.  This panel also includes deletion/duplication analysis (without sequencing) for one gene, EPCAM.  Those results are now back, the report date for which is November 20, 2014.  Genetic testing was normal, and did not reveal a deleterious mutation in these genes.  Additionally, no variants of uncertain significance (VUSs) were found.  The test report will be scanned into EPIC and will be located under the Media tab.   We discussed with [name] that since the current genetic testing is not perfect, it is possible there may be a gene mutation in one of these genes that current testing cannot detect, but that chance is small. We also discussed, that it is possible that another gene that has not yet been discovered, or that we have not yet tested, is responsible for the cancer diagnoses in the family, and it is, therefore, important to remain in touch with  cancer genetics in the future so that we can continue to offer [name] the most up to date genetic testing.   CANCER SCREENING RECOMMENDATIONS: While we still do not have an explanation for the personal and family history of breast cancer, this result is reassuring and indicates that [name] likely does not have an increased risk for a future cancer due to a mutation in one of these genes. This normal test also suggests that [name]'s cancer was most likely not due to an inherited predisposition associated with one of these genes.  Most cancers happen by chance and this negative test suggests that her cancer falls into this category.  [name] comes from a small family, so we do have less information regarding cancer risks than we would like.  Both her mother and her mother's only sister were diagnosed with breast cancer in their 54s.  On the other hand, these later ages of diagnosis may also be reassuring that this history is not cause by a hereditary cancer syndrome.  We, therefore, recommended she continue to follow the cancer management and screening guidelines provided by her oncology and primary healthcare providers.    RECOMMENDATIONS FOR FAMILY MEMBERS: Women in this family might be at some increased risk of developing cancer, over the general population risk, simply due to the family history of cancer. We recommended women in this family have a yearly mammogram beginning at age 52, or 58 years younger than the earliest onset of cancer, an an annual clinical breast exam, and perform monthly breast self-exams. We discussed that [name]'s daughters should begin mammogram screening by the age of 69.  Women  in this family should also have a gynecological exam as recommended by their primary provider. All family members should have a colonoscopy by age 67.  [name] sister, who is 68 and has not had breast cancer, may benefit from increased breast cancer screening such as breast MRI.  We  discussed that this sister's lifetime risks for breast cancer based on the Tyrer-Cuzick model is estimated to be approximately 26-27%.  Her female maternal first cousins might also benefit from increased screening, with risks estimated at approximately 25%.  It is important that these family members make their primary care providers aware of the family history and this increased risk, in the event that they are found to qualify for these additional screenings.  FOLLOW-UP: Lastly, we discussed with [name] that cancer genetics is a rapidly advancing field and it is possible that new genetic tests will be appropriate for her and/or her family members in the future. We encouraged her to remain in contact with cancer genetics on an annual basis so we can update her personal and family histories and let her know of advances in cancer genetics that may benefit this family.   Our contact number was provided. [name] questions were answered to her satisfaction, and she knows she is welcome to call us at anytime with additional questions or concerns.   Jeanine Luz MS Genetic Counselor [email or web address] Phone: [phone]

## 2014-11-21 NOTE — Telephone Encounter (Signed)
Ms. Ohaver called to discuss a letter she received from [facility] regarding more information needed by the lab for test coverage.  She was unsure what lab this letter was referring to, since we initially sent a sample to [facility] before sending another to [facility] for more comprehensive testing and potentially better insurance coverage.  We discussed that the [facility] test was cancelled, so that would not be a concern for [place] any longer.  Also, this letter is probably not coming from [facility] since they had just reported her results and did not call her with an out-of-pocket expense greater than $100.  I informed her that this is probably nothing to worry.  I will give [facility] a call to make sure that it's not coming from their end; if she doesn't hear from me, she can probably just shred this.  I also informed her that her test results have come back.  Those were negative for changes within any of 20 genes associated with increased risks for breast, ovarian, or other related cancers.  Additionally, no uncertain results were found.  We discussed that this still does not give [place] an explanation for her breast cancer or for the family history of breast cancer.  However, most cancers aren't genetic.  She is more than welcome to check back in the future about any updated testing options.  Cancer screening for her and her relatives will then just be based upon the personal and family history of breast cancer.  Her daughters should start screening mammograms at 16.  Her sister, who is 57 and who has not had breast cancer is at an increased risk since both Ms. Sabbagh and her mother were affected, as well as a maternal aunt.  I ran a quick Tyrer-Cuzick risk model for her and she would be at approximately 26-27% risk of developing breast cancer in her lifetime.  Thus, she should make sure her primary care provider is aware of the family history and perhaps have a discussion about increased breast screening options such as breast MRI.   The same could be said for her maternal first cousin who would be at an approximately 25% risk.

## 2014-11-22 ENCOUNTER — Telehealth: Payer: Self-pay | Admitting: Genetic Counselor

## 2014-11-22 NOTE — Telephone Encounter (Signed)
Discussed with Tracey Mckinney that I spoke with Tracey Mckinney, and they are not sure why she received a letter from Tracey Mckinney either.  They said we could fax a copy of that to them and they would look it over.  We also discussed that because Tracey Mckinney did not receive a call from the lab, she will not have to pay more than $100 dollars for genetic testing.

## 2014-11-23 ENCOUNTER — Ambulatory Visit
Admission: RE | Admit: 2014-11-23 | Discharge: 2014-11-23 | Disposition: A | Discharge status: Home / Self Care | Payer: Managed Care, Other (non HMO) | Source: Ambulatory Visit | Attending: Radiation Oncology | Admitting: Radiation Oncology

## 2014-11-23 ENCOUNTER — Encounter: Payer: Self-pay | Admitting: Radiation Oncology

## 2014-11-23 VITALS — BP 124/71 | HR 70 | Temp 97.9°F | Resp 16 | Ht 66.0 in | Wt 128.1 lb

## 2014-11-23 DIAGNOSIS — J45909 Unspecified asthma, uncomplicated: Secondary | ICD-10-CM | POA: Diagnosis not present

## 2014-11-23 DIAGNOSIS — Z803 Family history of malignant neoplasm of breast: Secondary | ICD-10-CM | POA: Diagnosis not present

## 2014-11-23 DIAGNOSIS — Z510 Encounter for antineoplastic radiation therapy: Secondary | ICD-10-CM | POA: Insufficient documentation

## 2014-11-23 DIAGNOSIS — C50411 Malignant neoplasm of upper-outer quadrant of right female breast: Secondary | ICD-10-CM

## 2014-11-23 DIAGNOSIS — Z79899 Other long term (current) drug therapy: Secondary | ICD-10-CM | POA: Insufficient documentation

## 2014-11-23 DIAGNOSIS — E039 Hypothyroidism, unspecified: Secondary | ICD-10-CM | POA: Diagnosis not present

## 2014-11-23 DIAGNOSIS — Z170 Estrogen receptor positive status [ER+]: Secondary | ICD-10-CM | POA: Diagnosis not present

## 2014-11-23 NOTE — Progress Notes (Signed)
Radiation Oncology         [phone] ________________________________  Outpatient Reconsultation  Name: Tracey Mckinney MRN: [id number]  Date: 11/23/2014  DOB: 1960/07/14  NL:[id number], Tommi Rumps, MD  Nicholas Lose, MD   REFERRING PHYSICIAN: Nicholas Lose, MD  DIAGNOSIS:    ICD-9-CM ICD-10-CM   1. 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 female who p/w a 09/24/14 mammogram/tomosynthesis showing an area of concern in the right upper outer quadrant of the breast. Ultrasound confirmed a a 1.8 cm mass, 3 cm from the nipple and a nodule 2 cm from the nipple. Biopsy on 09/28/14  revealed the 1.8 cm mass to be grade I IDC, DCIS and atypical lobular hyperplasia. The nodule 2 cm from the nipple showed fibrocystic changes with usual ductal hyperplasia. Pathology indicated ER/PR positive, Ki-67 5%.  She requested to see a female oncologist today, although she initially saw Dr Sondra Come at consult.   She underwent right breast lumpectomy and sentinel lymph node biopsy on 11/01/14, this revealed a 1.8 cm tumor, Grade 1 invasive ductal carcinoma with associated DCIS. Two sentinel nodes were negative. The lesion abuts the posterior margin but this was at the pectoralis. The patient saw Dr. Lindi Adie on 11/08/14 and oncotype testing was recommended. This revealed a score of 23, intermediate risk. Chemotherapy will not be given.  She never had cancer before this diagnosis. She has been experiencing shooting pains since her lumpectomy. She recently had her gall bladder removed.  PREVIOUS RADIATION THERAPY: No  PAST MEDICAL HISTORY:  has a past medical history of Chronic fatigue; Thyroid disease; Gallstones; Arthritis; PONV (postoperative nausea and vomiting); Asthma; Hypothyroidism; Depression; ADD  (attention deficit disorder); Wheat allergy; and Breast cancer (2016).    PAST SURGICAL HISTORY: Past Surgical History  Procedure Laterality Date  . Refractive surgery    . Eye surgery    . Breast surgery      right breast bx done in june 2-16  . Breast lumpectomy with radioactive seed and sentinel lymph node biopsy Right 11/01/2014    Procedure: BREAST LUMPECTOMY WITH RADIOACTIVE SEED AND SENTINEL LYMPH NODE BIOPSY;  Surgeon: [name], MD;  Location: [facility];  Service: General;  Laterality: Right;  . Cholecystectomy N/A 11/01/2014    Procedure: LAPAROSCOPIC CHOLECYSTECTOMY;  Surgeon: [name], MD;  Location: [facility];  Service: General;  Laterality: N/A;  . Eye surgery Right     vitrious detachment and hemorhage    FAMILY HISTORY: family history includes Breast cancer in her maternal aunt and mother.  SOCIAL HISTORY:  reports that she has never smoked. She does not have any smokeless tobacco history on file. She reports that she does not drink alcohol or use illicit drugs.  ALLERGIES: Sulfa antibiotics; Oxycodone; and Tramadol  MEDICATIONS:  Current Outpatient Prescriptions  Medication Sig Dispense Refill  . Cholecalciferol (VITAMIN D3) 5000 UNITS CAPS Take 1 capsule by mouth daily.     Marland Kitchen glucosamine-chondroitin 500-400 MG tablet Take 1 tablet by mouth daily.     . L-TYROSINE PO Take 250 mg elemental calcium/kg/hr by mouth daily.    Marland Kitchen levalbuterol (XOPENEX HFA) 45 MCG/ACT inhaler Inhale into the lungs every 4 (four) hours as needed for wheezing.    . Magnesium 500 MG CAPS Take 1 capsule by mouth  daily.    . metroNIDAZOLE (METROCREAM) 0.75 % cream Apply 1 application topically daily as needed.     . OVER THE COUNTER MEDICATION Take 100 mg by mouth daily. 5 HTP    . OVER THE COUNTER MEDICATION Take 400 mg by mouth daily. SAMe    . OVER THE COUNTER MEDICATION Take 500 mg by mouth daily. methionine    . Probiotic Product (ACIDOPHILUS/GOAT MILK) CAPS Take 1 capsule by mouth daily.       . Thyroid (NATURE-THROID PO) Take 16 mg by mouth daily. 1/4 grain = 16.2 mg.    . Multiple Vitamins-Minerals (MULTIVITAMIN WITH MINERALS) tablet Take 1 tablet by mouth daily.     No current facility-administered medications for this encounter.    REVIEW OF SYSTEMS:  Notable for that above.   PHYSICAL EXAM:  height is 5' 6" (1.676 m) and weight is 128 lb 1.6 oz (58.106 kg). Her oral temperature is 97.9 F (36.6 C). Her blood pressure is 124/71 and her pulse is 70. Her respiration is 16.   General: Alert and oriented, in no acute distress HEENT: Head is normocephalic. Extraocular movements are intact. Oropharynx is clear. Neck: Neck is supple, no palpable cervical or supraclavicular lymphadenopathy. Heart: Regular in rate and rhythm with no murmurs, rubs, or gallops. Chest: Clear to auscultation bilaterally, with no rhonchi, wheezes, or rales. Abdomen: Soft, nontender, nondistended, with no rigidity or guarding. Abdominal scars are healing well from her recent gall bladder surgery. Extremities: No cyanosis or edema. Lymphatics: see Neck Exam Skin: No concerning lesions. Musculoskeletal: symmetric strength and muscle tone throughout. Neurologic: Cranial nerves II through XII are grossly intact. No obvious focalities. Speech is fluent.  Psychiatric: Judgment and insight are intact. Affect is appropriate. Breast exam reveals Right breast has lumpectomy scar at the 9:30 position which has steri strips over it, as well as an axillary scar with steri strips.  Healing well.   ECOG = 0 0 - Asymptomatic (Fully active, able to carry on all predisease activities without restriction)  1 - Symptomatic but completely ambulatory (Restricted in physically strenuous activity but ambulatory and able to carry out work of a light or sedentary nature. For example, light housework, office work)  2 - Symptomatic, <50% in bed during the day (Ambulatory and capable of all self care but unable to carry out any  work activities. Up and about more than 50% of waking hours)  3 - Symptomatic, >50% in bed, but not bedbound (Capable of only limited self-care, confined to bed or chair 50% or more of waking hours)  4 - Bedbound (Completely disabled. Cannot carry on any self-care. Totally confined to bed or chair)  5 - Death   Oken MM, Creech RH, Tormey DC, et al. (1982). "Toxicity and response criteria of the Eastern Cooperative Oncology Group". Am. J. Clin. Oncol. 5 (6): 649-55   LABORATORY DATA:  Lab Results  Component Value Date   WBC 5.5 10/31/2014   HGB 13.2 10/31/2014   HCT 38.8 10/31/2014   MCV 86.0 10/31/2014   PLT 176 10/31/2014   CMP     Component Value Date/Time   NA 143 10/10/2014 1251   K 4.0 10/10/2014 1251   CO2 31* 10/10/2014 1251   GLUCOSE 107 10/10/2014 1251   BUN 14.7 10/10/2014 1251   CREATININE 1.0 10/10/2014 1251   CALCIUM 10.1 10/10/2014 1251   PROT 7.0 10/10/2014 1251   ALBUMIN 4.5 10/10/2014 1251   AST 18 10/10/2014 1251   ALT 9   10/10/2014 1251   ALKPHOS 71 10/10/2014 1251   BILITOT 0.43 10/10/2014 1251         RADIOGRAPHY: Nm Sentinel Node Inj-no Rpt (breast)  11/01/2014   CLINICAL DATA: right breast cancer   Sulfur colloid was injected intradermally by the nuclear medicine  technologist for breast cancer sentinel node localization.    Mm Breast Surgical Specimen  11/01/2014   CLINICAL DATA:  Status post lumpectomy for invasive ductal carcinoma.  EXAM: SPECIMEN RADIOGRAPH OF THE LEFT BREAST  COMPARISON:  Previous exam(s) including diagnostic mammogram of 10/30/2014.  FINDINGS: Status post excision of the right breast. The radioactive seed and biopsy marker clip are present, completely intact, and were marked for pathology. Locations of the seed and biopsy marker clip discussed with OR staff while patient was in the operating room.  IMPRESSION: Specimen radiograph of the right breast.   Electronically Signed   By: Stan  Maynard M.D.   On: 11/01/2014 10:09   Mm  Rt Radioactive Seed Loc Mammo Guide  10/30/2014   CLINICAL DATA:  53-year-old female for localization of right breast cancer prior to lumpectomy.  EXAM: MAMMOGRAPHIC GUIDED RADIOACTIVE SEED LOCALIZATION OF THE RIGHT BREAST  COMPARISON:  Previous exam(s).  FINDINGS: Patient presents for radioactive seed localization prior to right lumpectomy. I met with the patient and we discussed the procedure of seed localization including benefits and alternatives. We discussed the high likelihood of a successful procedure. We discussed the risks of the procedure including infection, bleeding, tissue injury and further surgery. We discussed the low dose of radioactivity involved in the procedure. Informed, written consent was given.  The usual time-out protocol was performed immediately prior to the procedure.  Using mammographic guidance, sterile technique, 2% lidocaine and an I-125 radioactive seed, the ribbon shaped biopsy clip was localized using a superior approach. The follow-up mammogram images confirm the seed in the expected location and are marked for Dr. Byerly.  Follow-up survey of the patient confirms presence of the radioactive seed.  Order number of I-125 seed:  [id number].  Total activity:  0.25 mCi  Reference Date: 10/05/2014  The patient tolerated the procedure well and was released from the [facility]. She was given instructions regarding seed removal.  IMPRESSION: Radioactive seed localization right breast. No apparent complications.   Electronically Signed   By: Jeffrey  Hu M.D.   On: 10/30/2014 14:17      IMPRESSION/PLAN: It was a pleasure meeting the patient today. We discussed the risks, benefits, and side effects of radiotherapy to the right breast. We discussed that radiation would take approximately 4 weeks to complete and that I would give the patient a few weeks to heal following surgery before starting treatment planning. We spoke about acute effects including skin irritation and fatigue as  well as much less common late effects including lung irritation. We spoke about the latest technology that is used to minimize the risk of late effects for breast cancer patients undergoing radiotherapy. No guarantees of treatment were given. The patient is enthusiastic about proceeding with treatment. I look forward to participating in the patient's care.   I spoke about the patient's pathology report with Dr. Byerly. She clarified that the posterior margin was at the pectoralis and there does not seem to be a good reason to attempt re-excision.   Simulation ordered for Aug 15. Starting RT a week thereafter.     This document serves as a record of services personally performed by Sarah Squire, MD. It was created on   her behalf by Elizabeth Ashley, a trained medical scribe. The creation of this record is based on the scribe's personal observations and the provider's statements to them. This document has been checked and approved by the attending provider. __________________________________________   Sarah Squire, MD    

## 2014-11-23 NOTE — Progress Notes (Signed)
Please see the Nurse Progress Note in the MD Initial Consult Encounter for this patient. 

## 2014-11-23 NOTE — Progress Notes (Signed)
Location of Breast Cancer: Breast cancer of upper-outer quadrant of right female breast  Histology per Pathology Report:   11/01/14  Diagnosis 1. Breast, lumpectomy, right - INVASIVE GRADE I DUCTAL CARCINOMA, SPANNING 1.8 CM IN GREATEST DIMENSION. - ASSOCIATED INTERMEDIATE GRADE DUCTAL CARCINOMA IN SITU AND CALCIFICATIONS. - LOBULAR CARCINOMA IN SITU PRESENT. - BOTH INVASIVE DUCTAL CARCINOMA AND DUCTAL CARCINOMA IN SITU ARE BOTH CLOSE (LESS THAN 0.2 CM) FROM POSTERIOR MARGIN. - INVASIVE DUCTAL CARCINOMA IS CLOSE (LESS THAN 0.2 CM) TO ANTERIOR MARGIN. - OTHER MARGINS ARE NEGATIVE. - SEE ONCOLOGY TEMPLATE. 2. Lymph node, sentinel, biopsy, right axillary #1 - ONE BENIGN LYMPH NODE WITH NO TUMOR SEEN (0/1). - SEE COMMENT.  09/28/14 Diagnosis 1. Breast, right, needle core biopsy, 10 o'clock, 5 cm/n - INVASIVE DUCTAL CARCINOMA. - DUCTAL CARCINOMA IN SITU - LOBULAR NEOPLASIA (ATYPICAL LOBULAR HYPERPLASIA). - SEE COMMENT. 2. Breast, right, needle core biopsy, 10 o'clock, 2 cm/n - FIBROCYSTIC CHANGES WITH USUAL DUCTAL HYPERPLASIA. - THERE IS NO EVIDENCE OF MALIGNANCY.  Receptor Status: ER(95%), PR (50%), Her2-neu (negative)  Did patient present with symptoms (if so, please note symptoms) or was this found on screening mammography?: Patient reports a history of having fluid filled cysts.  She reports the cancer was found by a mammogram.  Past/Anticipated interventions by surgeon, if any: 11/01/14 - Procedure: BREAST LUMPECTOMY WITH RADIOACTIVE SEED AND SENTINEL LYMPH NODE BIOPSY;  Surgeon: [name], MD;  Location: [facility];  Service: General;  Laterality: Right;  Past/Anticipated interventions by medical oncology, if any: antiestrogen therapy to follow radiation  Lymphedema issues, if any:  no   Pain issues, if any:  Rating as a 2/10 - soreness/occasional shooting pains in her right breast/underarm.  She also has soreness in her abdomen for a cholecystectomy on 11/01/14.  OB-GYN  history: Per Dr. Lindi Adie - "She menarched at early age of 67 and went to menopause at age 39. She had 2 pregnancy, her first child was born at age 54. She has received birth control pills for approximately 2 years, progesterone approximately 6 years testosterone for a year in 2014. She was never exposed to fertility medications or hormone replacement therapy. She has family history of Breast/GYN/GI cancer Mother age 6 breast cancer, maternal grandfather had unknown cancer."  SAFETY ISSUES:  Prior radiation? no  Pacemaker/ICD? no  Possible current pregnancy?no  Is the patient on methotrexate? no  Current Complaints / other details:  Patient is here with her husband.  BP 124/71 mmHg  Pulse 70  Temp(Src) 97.9 F (36.6 C) (Oral)  Resp 16  Ht 5' 6"  (1.676 m)  Wt 128 lb 1.6 oz (58.106 kg)  BMI 20.69 kg/m2

## 2014-11-27 ENCOUNTER — Other Ambulatory Visit: Payer: Self-pay | Admitting: Hematology and Oncology

## 2014-11-27 DIAGNOSIS — Z780 Asymptomatic menopausal state: Secondary | ICD-10-CM

## 2014-11-27 DIAGNOSIS — C50411 Malignant neoplasm of upper-outer quadrant of right female breast: Secondary | ICD-10-CM

## 2014-11-28 ENCOUNTER — Ambulatory Visit
Admission: RE | Admit: 2014-11-28 | Discharge: 2014-11-28 | Disposition: A | Discharge status: Home / Self Care | Payer: Managed Care, Other (non HMO) | Source: Ambulatory Visit | Attending: Hematology and Oncology | Admitting: Hematology and Oncology

## 2014-11-28 ENCOUNTER — Other Ambulatory Visit: Payer: Managed Care, Other (non HMO)

## 2014-11-28 DIAGNOSIS — C50411 Malignant neoplasm of upper-outer quadrant of right female breast: Secondary | ICD-10-CM

## 2014-11-28 DIAGNOSIS — Z780 Asymptomatic menopausal state: Secondary | ICD-10-CM

## 2014-11-30 ENCOUNTER — Telehealth: Payer: Self-pay

## 2014-11-30 NOTE — Telephone Encounter (Signed)
order for [facility]  Sent to scan.

## 2014-12-03 ENCOUNTER — Ambulatory Visit
Admission: RE | Admit: 2014-12-03 | Discharge: 2014-12-03 | Disposition: A | Discharge status: Home / Self Care | Payer: Managed Care, Other (non HMO) | Source: Ambulatory Visit | Attending: Radiation Oncology | Admitting: Radiation Oncology

## 2014-12-03 ENCOUNTER — Ambulatory Visit: Payer: Managed Care, Other (non HMO) | Admitting: Radiation Oncology

## 2014-12-03 DIAGNOSIS — C50411 Malignant neoplasm of upper-outer quadrant of right female breast: Secondary | ICD-10-CM

## 2014-12-03 DIAGNOSIS — Z510 Encounter for antineoplastic radiation therapy: Secondary | ICD-10-CM | POA: Diagnosis not present

## 2014-12-03 NOTE — Progress Notes (Signed)
  Radiation Oncology         [id number]) [phone] ________________________________  Name: Tracey Mckinney MRN: [id number]  Date: 12/03/2014  DOB: [birth year]  SIMULATION AND TREATMENT PLANNING NOTE    outpatient  DIAGNOSIS:     ICD-9-CM ICD-10-CM   1. Breast cancer of upper-outer quadrant of right female breast 174.4 C50.411     NARRATIVE:  The patient was brought to the [facility].  Identity was confirmed.  All relevant records and images related to the planned course of therapy were reviewed.  The patient freely provided informed written consent to proceed with treatment after reviewing the details related to the planned course of therapy. The consent form was witnessed and verified by the simulation staff.    Then, the patient was set-up in a stable reproducible supine position for radiation therapy with her ipsilateral arm over her head, and her upper body secured in a custom-made Vac-lok device.  CT images were obtained.  Surface markings were placed.  The CT images were loaded into the planning software.    TREATMENT PLANNING NOTE: Treatment planning then occurred.  The radiation prescription was entered and confirmed.     A total of 3 medically necessary complex treatment devices were fabricated and supervised by me: 2 fields with MLCs for custom blocks to protect heart, and lungs;  and, a Vac-lok. I have requested : 3D Simulation  I have requested a DVH of the following structures: lungs, heart, lumpectomy cavity.    The patient will receive 40.05 Gy in 15 fractions to the right breast with 2 tangential fields.   This will  be followed by a boost.  Optical Surface Tracking Plan:  Since intensity modulated radiotherapy (IMRT) and 3D conformal radiation treatment methods are predicated on accurate and precise positioning for treatment, intrafraction motion monitoring is medically necessary to ensure accurate and safe treatment delivery. The ability to quantify intrafraction  motion without excessive ionizing radiation dose can only be performed with optical surface tracking. Accordingly, surface imaging offers the opportunity to obtain 3D measurements of patient position throughout IMRT and 3D treatments without excessive radiation exposure. I am ordering optical surface tracking for this patient's upcoming course of radiotherapy.  ________________________________   Reference:  Ursula Alert, J, et al. Surface imaging-based analysis of intrafraction motion for breast radiotherapy patients.Journal of [place], n. 6, nov. 2014. ISSN [id number].  Available at: <http://www.jacmp.org/index.php/jacmp/article/view/4957>.    -----------------------------------  Eppie Gibson, MD

## 2014-12-05 DIAGNOSIS — Z510 Encounter for antineoplastic radiation therapy: Secondary | ICD-10-CM | POA: Diagnosis not present

## 2014-12-06 ENCOUNTER — Encounter (INDEPENDENT_AMBULATORY_CARE_PROVIDER_SITE_OTHER): Payer: Managed Care, Other (non HMO) | Admitting: Ophthalmology

## 2014-12-06 DIAGNOSIS — H2513 Age-related nuclear cataract, bilateral: Secondary | ICD-10-CM | POA: Diagnosis not present

## 2014-12-06 DIAGNOSIS — H33303 Unspecified retinal break, bilateral: Secondary | ICD-10-CM | POA: Diagnosis not present

## 2014-12-06 DIAGNOSIS — H43813 Vitreous degeneration, bilateral: Secondary | ICD-10-CM

## 2014-12-07 DIAGNOSIS — Z510 Encounter for antineoplastic radiation therapy: Secondary | ICD-10-CM | POA: Diagnosis not present

## 2014-12-10 ENCOUNTER — Ambulatory Visit
Admission: RE | Admit: 2014-12-10 | Discharge: 2014-12-10 | Disposition: A | Discharge status: Home / Self Care | Payer: Managed Care, Other (non HMO) | Source: Ambulatory Visit | Attending: Radiation Oncology | Admitting: Radiation Oncology

## 2014-12-10 DIAGNOSIS — Z510 Encounter for antineoplastic radiation therapy: Secondary | ICD-10-CM | POA: Diagnosis not present

## 2014-12-11 ENCOUNTER — Inpatient Hospital Stay
Admission: RE | Admit: 2014-12-11 | Payer: Managed Care, Other (non HMO) | Source: Ambulatory Visit | Admitting: Radiation Oncology

## 2014-12-11 ENCOUNTER — Ambulatory Visit: Payer: Managed Care, Other (non HMO) | Admitting: Radiation Oncology

## 2014-12-11 ENCOUNTER — Ambulatory Visit
Admission: RE | Admit: 2014-12-11 | Discharge: 2014-12-11 | Disposition: A | Discharge status: Home / Self Care | Payer: Managed Care, Other (non HMO) | Source: Ambulatory Visit | Attending: Radiation Oncology | Admitting: Radiation Oncology

## 2014-12-11 ENCOUNTER — Other Ambulatory Visit: Payer: Self-pay

## 2014-12-11 DIAGNOSIS — Z510 Encounter for antineoplastic radiation therapy: Secondary | ICD-10-CM | POA: Diagnosis not present

## 2014-12-12 ENCOUNTER — Ambulatory Visit
Admission: RE | Admit: 2014-12-12 | Discharge: 2014-12-12 | Disposition: A | Discharge status: Home / Self Care | Payer: Managed Care, Other (non HMO) | Source: Ambulatory Visit | Attending: Radiation Oncology | Admitting: Radiation Oncology

## 2014-12-12 ENCOUNTER — Telehealth: Payer: Self-pay | Admitting: Hematology and Oncology

## 2014-12-12 DIAGNOSIS — Z510 Encounter for antineoplastic radiation therapy: Secondary | ICD-10-CM | POA: Diagnosis not present

## 2014-12-12 NOTE — Telephone Encounter (Signed)
Called patient with her appointment and she is aware

## 2014-12-13 ENCOUNTER — Encounter: Payer: Self-pay | Admitting: Radiation Oncology

## 2014-12-13 ENCOUNTER — Encounter: Payer: Self-pay | Admitting: *Deleted

## 2014-12-13 ENCOUNTER — Ambulatory Visit
Admission: RE | Admit: 2014-12-13 | Discharge: 2014-12-13 | Disposition: A | Discharge status: Home / Self Care | Payer: Managed Care, Other (non HMO) | Source: Ambulatory Visit | Attending: Radiation Oncology | Admitting: Radiation Oncology

## 2014-12-13 ENCOUNTER — Telehealth: Payer: Self-pay

## 2014-12-13 DIAGNOSIS — Z510 Encounter for antineoplastic radiation therapy: Secondary | ICD-10-CM | POA: Diagnosis not present

## 2014-12-13 NOTE — Telephone Encounter (Signed)
Bone density results dtd 11/28/14 rcvd from [facility].  Reviewed by Dr. Lindi Adie.  Sent to scan.

## 2014-12-13 NOTE — Progress Notes (Signed)
Patient left a message to call back. I sent email to [facility] is her ph#

## 2014-12-13 NOTE — Progress Notes (Signed)
[place] Work  Clinical Social Work was referred by patient for assessment of psychosocial needs due to financial concerns.  Pt reports she attempted to apply for grant funds prior to surgery, but was denied as she was not doing chemo or radiation yet. Pt appears eligible now and CSW reviewed other options for assistance. Pt plans to contact Cancer Care, pink fund and catherine fund to begin process. Pt will then gather needed documents and bring to meet with pt on 12/20/14 to further complete applications. Referral made to [facility] as well.    Clinical Social Work interventions: Supportive Warden/ranger.   Loren Racer, [place] Worker [place]  [place] Phone: [phone] Fax: [phone]

## 2014-12-14 ENCOUNTER — Ambulatory Visit
Admission: RE | Admit: 2014-12-14 | Discharge: 2014-12-14 | Disposition: A | Discharge status: Home / Self Care | Payer: Managed Care, Other (non HMO) | Source: Ambulatory Visit | Attending: Radiation Oncology | Admitting: Radiation Oncology

## 2014-12-14 DIAGNOSIS — Z510 Encounter for antineoplastic radiation therapy: Secondary | ICD-10-CM | POA: Diagnosis not present

## 2014-12-17 ENCOUNTER — Encounter: Payer: Self-pay | Admitting: Radiation Oncology

## 2014-12-17 ENCOUNTER — Ambulatory Visit
Admission: RE | Admit: 2014-12-17 | Discharge: 2014-12-17 | Disposition: A | Discharge status: Home / Self Care | Payer: Managed Care, Other (non HMO) | Source: Ambulatory Visit | Attending: Radiation Oncology | Admitting: Radiation Oncology

## 2014-12-17 ENCOUNTER — Telehealth: Payer: Self-pay | Admitting: *Deleted

## 2014-12-17 VITALS — BP 107/62 | HR 66 | Resp 16 | Wt 129.2 lb

## 2014-12-17 DIAGNOSIS — Z510 Encounter for antineoplastic radiation therapy: Secondary | ICD-10-CM | POA: Diagnosis not present

## 2014-12-17 DIAGNOSIS — C50411 Malignant neoplasm of upper-outer quadrant of right female breast: Secondary | ICD-10-CM

## 2014-12-17 MED ORDER — ALRA NON-METALLIC DEODORANT (RAD-ONC)
1.0000 "application " | Freq: Once | TOPICAL | Status: AC
  Administered 2014-12-17: 1 via TOPICAL

## 2014-12-17 MED ORDER — RADIAPLEXRX EX GEL
Freq: Once | CUTANEOUS | Status: AC
Start: 2014-12-17 — End: 2014-12-17
  Administered 2014-12-17: 16:00:00 via TOPICAL

## 2014-12-17 NOTE — Addendum Note (Signed)
Encounter addended by: Heywood Footman, RN on: 12/17/2014  4:03 PM<BR>     Documentation filed: Notes Section

## 2014-12-17 NOTE — Progress Notes (Addendum)
Weight and vitals stable. Denies pain. No skin changes within treatment field reported. Denies fatigue. Oriented patient to staff and routine of the clinic. Provided patient with RADIATION THERAPY AND YOU handbook then, reviewed pertinent information. Educated patient reference potential side effects and management such as fatigue and skin changes. Provided patient with radiaplex and alra then, directed upon use. Answered all patient questions to the best of my ability. Patient verbalized understanding of all reviewed.   BP 107/62 mmHg  Pulse 66  Resp 16  Wt 129 lb 3.2 oz (58.605 kg) Wt Readings from Last 3 Encounters:  12/17/14 129 lb 3.2 oz (58.605 kg)  11/23/14 128 lb 1.6 oz (58.106 kg)  11/08/14 127 lb 9.6 oz (57.879 kg)

## 2014-12-17 NOTE — Telephone Encounter (Signed)
Left vm for pt to return call regarding needs during xrt. 

## 2014-12-17 NOTE — Progress Notes (Signed)
   Weekly Management Note:  Outpatient    ICD-9-CM ICD-10-CM   1. Breast cancer of upper-outer quadrant of right female breast 174.4 C50.411     Current Dose:  13.35 Gy  Projected Dose: 50.05 Gy   Narrative:  The patient presents for routine under treatment assessment.  CBCT/MVCT images/Port film x-rays were reviewed.  The chart was checked. No complaints  Physical Findings:  weight is 129 lb 3.2 oz (58.605 kg). Her blood pressure is 107/62 and her pulse is 66. Her respiration is 16.   Wt Readings from Last 3 Encounters:  12/17/14 129 lb 3.2 oz (58.605 kg)  11/23/14 128 lb 1.6 oz (58.106 kg)  11/08/14 127 lb 9.6 oz (57.879 kg)  no skin irritation thus far  Impression:  The patient is tolerating radiotherapy.  Plan:  Continue radiotherapy as planned.    ________________________________   Eppie Gibson, M.D.

## 2014-12-17 NOTE — Addendum Note (Signed)
Encounter addended by: Heywood Footman, RN on: 12/17/2014  3:50 PM<BR>     Documentation filed: [employer], Inpatient MAR, Orders

## 2014-12-18 ENCOUNTER — Ambulatory Visit: Payer: Managed Care, Other (non HMO) | Admitting: Radiation Oncology

## 2014-12-18 ENCOUNTER — Ambulatory Visit
Admission: RE | Admit: 2014-12-18 | Discharge: 2014-12-18 | Disposition: A | Discharge status: Home / Self Care | Payer: Managed Care, Other (non HMO) | Source: Ambulatory Visit | Attending: Radiation Oncology | Admitting: Radiation Oncology

## 2014-12-18 DIAGNOSIS — Z510 Encounter for antineoplastic radiation therapy: Secondary | ICD-10-CM | POA: Diagnosis not present

## 2014-12-19 ENCOUNTER — Ambulatory Visit
Admission: RE | Admit: 2014-12-19 | Discharge: 2014-12-19 | Disposition: A | Discharge status: Home / Self Care | Payer: Managed Care, Other (non HMO) | Source: Ambulatory Visit | Attending: Radiation Oncology | Admitting: Radiation Oncology

## 2014-12-19 ENCOUNTER — Ambulatory Visit (INDEPENDENT_AMBULATORY_CARE_PROVIDER_SITE_OTHER): Payer: Managed Care, Other (non HMO) | Admitting: Ophthalmology

## 2014-12-19 VITALS — BP 116/79 | HR 67 | Temp 98.2°F | Wt 129.0 lb

## 2014-12-19 DIAGNOSIS — Z510 Encounter for antineoplastic radiation therapy: Secondary | ICD-10-CM | POA: Diagnosis not present

## 2014-12-19 DIAGNOSIS — H33302 Unspecified retinal break, left eye: Secondary | ICD-10-CM

## 2014-12-19 DIAGNOSIS — C50411 Malignant neoplasm of upper-outer quadrant of right female breast: Secondary | ICD-10-CM

## 2014-12-19 NOTE — Progress Notes (Signed)
Weekly assessment of radiation to right breast.Completed 7 of 20 fractions.No skin changes.Has  Warm sensation of breast at times and occasional shooting pain which I informed is normal. BP 116/79 mmHg  Pulse 67  Temp(Src) 98.2 F (36.8 C)  Wt 129 lb (58.514 kg)

## 2014-12-19 NOTE — Progress Notes (Signed)
   Weekly Management Note:  Outpatient    ICD-9-CM ICD-10-CM   1. Breast cancer of upper-outer quadrant of right female breast 174.4 C50.411     Current Dose:  18.69 Gy  Projected Dose: 50.05 Gy   Narrative:  The patient presents for routine under treatment assessment.  CBCT/MVCT images/Port film x-rays were reviewed.  The chart was checked. No complaints  Physical Findings:  weight is 129 lb (58.514 kg). Her temperature is 98.2 F (36.8 C). Her blood pressure is 116/79 and her pulse is 67.   Wt Readings from Last 3 Encounters:  12/19/14 129 lb (58.514 kg)  12/17/14 129 lb 3.2 oz (58.605 kg)  11/23/14 128 lb 1.6 oz (58.106 kg)  mild tanning of right breast    Impression:  The patient is tolerating radiotherapy.  Plan:  Continue radiotherapy as planned.    ________________________________   Eppie Gibson, M.D.

## 2014-12-20 ENCOUNTER — Ambulatory Visit
Admission: RE | Admit: 2014-12-20 | Discharge: 2014-12-20 | Disposition: A | Discharge status: Home / Self Care | Payer: Managed Care, Other (non HMO) | Source: Ambulatory Visit | Attending: Radiation Oncology | Admitting: Radiation Oncology

## 2014-12-20 DIAGNOSIS — Z510 Encounter for antineoplastic radiation therapy: Secondary | ICD-10-CM | POA: Diagnosis not present

## 2014-12-21 ENCOUNTER — Ambulatory Visit
Admission: RE | Admit: 2014-12-21 | Discharge: 2014-12-21 | Disposition: A | Discharge status: Home / Self Care | Payer: Managed Care, Other (non HMO) | Source: Ambulatory Visit | Attending: Radiation Oncology | Admitting: Radiation Oncology

## 2014-12-21 ENCOUNTER — Encounter: Payer: Self-pay | Admitting: *Deleted

## 2014-12-21 DIAGNOSIS — Z510 Encounter for antineoplastic radiation therapy: Secondary | ICD-10-CM | POA: Diagnosis not present

## 2014-12-25 ENCOUNTER — Ambulatory Visit
Admission: RE | Admit: 2014-12-25 | Discharge: 2014-12-25 | Disposition: A | Discharge status: Home / Self Care | Payer: Managed Care, Other (non HMO) | Source: Ambulatory Visit | Attending: Radiation Oncology | Admitting: Radiation Oncology

## 2014-12-25 DIAGNOSIS — Z510 Encounter for antineoplastic radiation therapy: Secondary | ICD-10-CM | POA: Diagnosis not present

## 2014-12-25 NOTE — Progress Notes (Signed)
[place] Work  Clinical Social Work met with pt and her husband to complete the [employer] CSW also received financial paperwork that was sent to [place], financial advocate on behalf of pt. 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 CSW mailed off [facility] application, pt is still waiting to receive [place] application.    Clinical Social Work interventions: Resource assistance  Loren Racer, [place] Worker [place]  [place] Phone: [phone] Fax: [phone]

## 2014-12-26 ENCOUNTER — Ambulatory Visit
Admission: RE | Admit: 2014-12-26 | Discharge: 2014-12-26 | Disposition: A | Discharge status: Home / Self Care | Payer: Managed Care, Other (non HMO) | Source: Ambulatory Visit | Attending: Radiation Oncology | Admitting: Radiation Oncology

## 2014-12-26 ENCOUNTER — Encounter: Payer: Self-pay | Admitting: Radiation Oncology

## 2014-12-26 VITALS — BP 102/62 | HR 65 | Temp 98.7°F | Resp 12

## 2014-12-26 DIAGNOSIS — C50411 Malignant neoplasm of upper-outer quadrant of right female breast: Secondary | ICD-10-CM

## 2014-12-26 DIAGNOSIS — Z510 Encounter for antineoplastic radiation therapy: Secondary | ICD-10-CM | POA: Diagnosis not present

## 2014-12-26 NOTE — Progress Notes (Signed)
   Weekly Management Note:  Outpatient    ICD-9-CM ICD-10-CM   1. Breast cancer of upper-outer quadrant of right female breast 174.4 C50.411     Current Dose:  29.37 Gy  Projected Dose: 50.05 Gy   Narrative:  The patient presents for routine under treatment assessment.  CBCT/MVCT images/Port film x-rays were reviewed.  The chart was checked. No complaints  Physical Findings:  oral temperature is 98.7 F (37.1 C). Her blood pressure is 102/62 and her pulse is 65. Her respiration is 12 and oxygen saturation is 100%.   Wt Readings from Last 3 Encounters:  12/19/14 129 lb (58.514 kg)  12/17/14 129 lb 3.2 oz (58.605 kg)  11/23/14 128 lb 1.6 oz (58.106 kg)  mild tanning of right breast    Impression:  The patient is tolerating radiotherapy.  Plan:  Continue radiotherapy as planned.    ________________________________   Eppie Gibson, M.D.

## 2014-12-26 NOTE — Progress Notes (Signed)
PAIN: She is currently in no pain.  SKIN: Pt right breast- positive for erythema.  Pt denies edema.  Pt continues to apply Radiaplex as directed.  BP 102/62 mmHg  Pulse 65  Temp(Src) 98.7 F (37.1 C) (Oral)  Resp 12  SpO2 100% Wt Readings from Last 3 Encounters:  12/19/14 129 lb (58.514 kg)  12/17/14 129 lb 3.2 oz (58.605 kg)  11/23/14 128 lb 1.6 oz (58.106 kg)

## 2014-12-27 ENCOUNTER — Ambulatory Visit
Admission: RE | Admit: 2014-12-27 | Discharge: 2014-12-27 | Disposition: A | Discharge status: Home / Self Care | Payer: Managed Care, Other (non HMO) | Source: Ambulatory Visit | Attending: Radiation Oncology | Admitting: Radiation Oncology

## 2014-12-27 ENCOUNTER — Encounter: Payer: Self-pay | Admitting: *Deleted

## 2014-12-27 DIAGNOSIS — Z510 Encounter for antineoplastic radiation therapy: Secondary | ICD-10-CM | POA: Diagnosis not present

## 2014-12-27 NOTE — Progress Notes (Signed)
Tracey Mckinney  Clinical Social Mckinney following to assist with Curator. CSW submitted Cancer Care and [facility] applications on behalf of patient. CSW updated pt on submissions and will continue to follow and assist as appropriate.   Clinical Social Mckinney interventions: Resource assistance Loren Racer, [place] Worker [place]  [place] Phone: [phone] Fax: [phone]

## 2014-12-28 ENCOUNTER — Ambulatory Visit
Admission: RE | Admit: 2014-12-28 | Discharge: 2014-12-28 | Disposition: A | Discharge status: Home / Self Care | Payer: Managed Care, Other (non HMO) | Source: Ambulatory Visit | Attending: Radiation Oncology | Admitting: Radiation Oncology

## 2014-12-28 DIAGNOSIS — Z510 Encounter for antineoplastic radiation therapy: Secondary | ICD-10-CM | POA: Diagnosis not present

## 2014-12-31 ENCOUNTER — Encounter: Payer: Self-pay | Admitting: Radiation Oncology

## 2014-12-31 ENCOUNTER — Ambulatory Visit
Admission: RE | Admit: 2014-12-31 | Discharge: 2014-12-31 | Disposition: A | Discharge status: Home / Self Care | Payer: Managed Care, Other (non HMO) | Source: Ambulatory Visit | Attending: Radiation Oncology | Admitting: Radiation Oncology

## 2014-12-31 VITALS — BP 113/69 | HR 57 | Temp 98.2°F | Resp 16 | Ht 66.0 in | Wt 126.1 lb

## 2014-12-31 DIAGNOSIS — Z510 Encounter for antineoplastic radiation therapy: Secondary | ICD-10-CM | POA: Diagnosis not present

## 2014-12-31 DIAGNOSIS — C50411 Malignant neoplasm of upper-outer quadrant of right female breast: Secondary | ICD-10-CM

## 2014-12-31 NOTE — Progress Notes (Signed)
[employer] has completed 14 fractions to her right breast.  She reports burning pain in her right breast at a 1/10.  She reports fatigue.  The skin on her right breast is pink/red.  She is using radiaplex 2-3 times a day.  BP 113/69 mmHg  Pulse 57  Temp(Src) 98.2 F (36.8 C) (Oral)  Resp 16  Ht 5\' 6"  (1.676 m)  Wt 126 lb 1.6 oz (57.199 kg)  BMI 20.36 kg/m2

## 2014-12-31 NOTE — Progress Notes (Signed)
   Weekly Management Note:  Outpatient    ICD-9-CM ICD-10-CM   1. Breast cancer of upper-outer quadrant of right female breast 174.4 C50.411     Current Dose:  37.38 Gy  Projected Dose: 50.05 Gy   Narrative:  The patient presents for routine under treatment assessment.  CBCT/MVCT images/Port film x-rays were reviewed.  The chart was checked. No complaints  Physical Findings:  height is 5\' 6"  (1.676 m) and weight is 126 lb 1.6 oz (57.199 kg). Her oral temperature is 98.2 F (36.8 C). Her blood pressure is 113/69 and her pulse is 57. Her respiration is 16.   Wt Readings from Last 3 Encounters:  12/31/14 126 lb 1.6 oz (57.199 kg)  12/19/14 129 lb (58.514 kg)  12/17/14 129 lb 3.2 oz (58.605 kg)   tanning of right breast    Impression:  The patient is tolerating radiotherapy.  Plan:  Continue radiotherapy as planned.    ________________________________   Eppie Gibson, M.D.

## 2015-01-01 ENCOUNTER — Ambulatory Visit
Admission: RE | Admit: 2015-01-01 | Discharge: 2015-01-01 | Disposition: A | Discharge status: Home / Self Care | Payer: Managed Care, Other (non HMO) | Source: Ambulatory Visit | Attending: Radiation Oncology | Admitting: Radiation Oncology

## 2015-01-01 ENCOUNTER — Ambulatory Visit: Payer: Managed Care, Other (non HMO) | Admitting: Radiation Oncology

## 2015-01-01 DIAGNOSIS — Z510 Encounter for antineoplastic radiation therapy: Secondary | ICD-10-CM | POA: Diagnosis not present

## 2015-01-02 ENCOUNTER — Ambulatory Visit: Admission: RE | Admit: 2015-01-02 | Payer: Managed Care, Other (non HMO) | Source: Ambulatory Visit

## 2015-01-02 ENCOUNTER — Ambulatory Visit
Admission: RE | Admit: 2015-01-02 | Payer: Managed Care, Other (non HMO) | Source: Ambulatory Visit | Admitting: Radiation Oncology

## 2015-01-02 ENCOUNTER — Ambulatory Visit
Admission: RE | Admit: 2015-01-02 | Discharge: 2015-01-02 | Disposition: A | Discharge status: Home / Self Care | Payer: Managed Care, Other (non HMO) | Source: Ambulatory Visit | Attending: Radiation Oncology | Admitting: Radiation Oncology

## 2015-01-03 ENCOUNTER — Ambulatory Visit
Admission: RE | Admit: 2015-01-03 | Discharge: 2015-01-03 | Disposition: A | Discharge status: Home / Self Care | Payer: Managed Care, Other (non HMO) | Source: Ambulatory Visit | Attending: Radiation Oncology | Admitting: Radiation Oncology

## 2015-01-03 ENCOUNTER — Ambulatory Visit: Payer: Managed Care, Other (non HMO)

## 2015-01-03 DIAGNOSIS — Z510 Encounter for antineoplastic radiation therapy: Secondary | ICD-10-CM | POA: Diagnosis not present

## 2015-01-04 ENCOUNTER — Ambulatory Visit
Admission: RE | Admit: 2015-01-04 | Discharge: 2015-01-04 | Disposition: A | Discharge status: Home / Self Care | Payer: Managed Care, Other (non HMO) | Source: Ambulatory Visit | Attending: Radiation Oncology | Admitting: Radiation Oncology

## 2015-01-04 DIAGNOSIS — Z510 Encounter for antineoplastic radiation therapy: Secondary | ICD-10-CM | POA: Diagnosis not present

## 2015-01-07 ENCOUNTER — Ambulatory Visit
Admission: RE | Admit: 2015-01-07 | Discharge: 2015-01-07 | Disposition: A | Discharge status: Home / Self Care | Payer: Managed Care, Other (non HMO) | Source: Ambulatory Visit | Attending: Radiation Oncology | Admitting: Radiation Oncology

## 2015-01-07 ENCOUNTER — Encounter: Payer: Self-pay | Admitting: Radiation Oncology

## 2015-01-07 VITALS — BP 126/73 | HR 70 | Temp 98.1°F | Resp 16 | Wt 128.2 lb

## 2015-01-07 DIAGNOSIS — Z510 Encounter for antineoplastic radiation therapy: Secondary | ICD-10-CM | POA: Diagnosis not present

## 2015-01-07 DIAGNOSIS — C50411 Malignant neoplasm of upper-outer quadrant of right female breast: Secondary | ICD-10-CM

## 2015-01-07 NOTE — Progress Notes (Signed)
Weekly rad txs right breast ,mild erythema, c/o burning at times on breast and sharp pains occasionally,radiaplex sooths this uses 3-4x day. Appetite okay, energy level low, last Thursday Friday slight nausea after treatment,   4:16 PM BP 126/73 mmHg  Pulse 70  Temp(Src) 98.1 F (36.7 C) (Oral)  Resp 16  Wt 128 lb 3.2 oz (58.151 kg)  Wt Readings from Last 3 Encounters:  01/07/15 128 lb 3.2 oz (58.151 kg)  12/31/14 126 lb 1.6 oz (57.199 kg)  12/19/14 129 lb (58.514 kg)

## 2015-01-07 NOTE — Progress Notes (Signed)
   Weekly Management Note:  Outpatient    ICD-9-CM ICD-10-CM   1. Breast cancer of upper-outer quadrant of right female breast 174.4 C50.411     Current Dose: 46.CJ.Roan  Projected Dose: 50.05 Gy   Narrative:  The patient presents for routine under treatment assessment.  CBCT/MVCT images/Port film x-rays were reviewed.  The chart was checked. Doing well - some irritation in UIQ on right breast   Physical Findings:  weight is 128 lb 3.2 oz (58.151 kg). Her oral temperature is 98.1 F (36.7 C). Her blood pressure is 126/73 and her pulse is 70. Her respiration is 16.   Wt Readings from Last 3 Encounters:  01/07/15 128 lb 3.2 oz (58.151 kg)  12/31/14 126 lb 1.6 oz (57.199 kg)  12/19/14 129 lb (58.514 kg)  Erythema of right breast  with dermatitis (modest) at UIQ  Impression:  The patient is tolerating radiotherapy.  Plan:  Continue radiotherapy as planned.  F/u in 22mo  ________________________________   Eppie Gibson, M.D.

## 2015-01-08 ENCOUNTER — Ambulatory Visit: Payer: Managed Care, Other (non HMO)

## 2015-01-08 ENCOUNTER — Telehealth: Payer: Self-pay | Admitting: Hematology and Oncology

## 2015-01-08 ENCOUNTER — Encounter: Payer: Self-pay | Admitting: Hematology and Oncology

## 2015-01-08 ENCOUNTER — Ambulatory Visit (HOSPITAL_BASED_OUTPATIENT_CLINIC_OR_DEPARTMENT_OTHER): Payer: Managed Care, Other (non HMO) | Admitting: Hematology and Oncology

## 2015-01-08 ENCOUNTER — Ambulatory Visit
Admission: RE | Admit: 2015-01-08 | Discharge: 2015-01-08 | Disposition: A | Discharge status: Home / Self Care | Payer: Managed Care, Other (non HMO) | Source: Ambulatory Visit | Attending: Radiation Oncology | Admitting: Radiation Oncology

## 2015-01-08 VITALS — BP 120/76 | HR 60 | Temp 98.1°F | Resp 18 | Ht 66.0 in | Wt 127.7 lb

## 2015-01-08 DIAGNOSIS — C50411 Malignant neoplasm of upper-outer quadrant of right female breast: Secondary | ICD-10-CM | POA: Diagnosis not present

## 2015-01-08 DIAGNOSIS — Z510 Encounter for antineoplastic radiation therapy: Secondary | ICD-10-CM | POA: Diagnosis not present

## 2015-01-08 MED ORDER — ANASTROZOLE 1 MG PO TABS: 1.0000 mg | ORAL_TABLET | Freq: Every day | ORAL | Status: DC

## 2015-01-08 NOTE — Assessment & Plan Note (Signed)
Right lumpectomy 11/01/2014: IDC grade 1, 1.8 cm, associated intermediate grade DCIS, LCIS, posterior margin less than 0.2 cm for IDC plus DCIS, 0/2 lymph nodes; ER 95%, PR 50%, HER-2 negative ratio 1.3, Ki-67 5%, T1 cN0 stage IA, Oncotype DX 23, 15% risk of recurrence, currently on adjuvant radiation therapy  Plan: 1. Anti-estrogen therapy with tamoxifen 20 mg daily Tamoxifen counseling: We discussed the risks and benefits of tamoxifen. These include but not limited to insomnia, hot flashes, mood changes, vaginal dryness, and weight gain. Although rare, serious side effects including endometrial cancer, risk of blood clots were also discussed. We strongly believe that the benefits far outweigh the risks. Patient understands these risks and consented to starting treatment. Planned treatment duration is 5-10 years.  Return to clinic in one month to assess tolerability to treatment

## 2015-01-08 NOTE — Progress Notes (Signed)
Patient Care Team: Arnetha Gula, MD as PCP - General (Family Medicine) Erroll Luna, MD as Consulting Physician (General Surgery) Nicholas Lose, MD as Consulting Physician (Hematology and Oncology) Gery Pray, MD as Consulting Physician (Radiation Oncology) Mauro Kaufmann, RN as Registered Nurse Rockwell Germany, RN as Registered Nurse Holley Bouche, NP as Nurse Practitioner (Nurse Practitioner) Meta Hatchet, LCSW as Social Worker  DIAGNOSIS: Breast cancer of upper-outer quadrant of right female breast   Staging form: Breast, AJCC 7th Edition     Clinical stage from 10/10/2014: Stage IA (T1c, N0, M0) - Unsigned   SUMMARY OF ONCOLOGIC HISTORY:   Breast cancer of upper-outer quadrant of right female breast   09/28/2014 Initial Diagnosis Right breast needle biopsy 10:00 position: 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%, PR 50%, Ki-67 5%, HER-2 negative, grade 1; right breast biopsy 10:00 2 cm from nipple: Fibrocystic changes with usual ductal hyperplasia   09/28/2014 Mammogram Mammogram and ultrasound suspicious irregular mass at 10:00 position right breast 1.8 cm in addition 3 small nodules 0.5, 0.6, 0.5 cm biopsy proven to be benign fibrocystic changes   11/01/2014 Surgery Right lumpectomy: IDC grade 1, 1.8 cm, associated intermediate grade DCIS, LCIS, posterior margin less than 0.2 cm for IDC plus DCIS, 0/2 lymph nodes; ER 95%, PR 50%, HER-2 negative ratio 1.3, Ki-67 5%, T1 cN0 stage IA Oncotype 23, 16% ROR   12/10/2014 -  Radiation Therapy Adjuvant radiation therapy    CHIEF COMPLIANT: Patient completed radiation therapy 01/09/2015  INTERVAL HISTORY: Tracey Mckinney is a 54 year old with above-mentioned history right breast cancer treated with lumpectomy and did not need chemotherapy because she had Oncotype score of 23. She finishes adjuvant radiation therapy tomorrow. She is extremely tired with some bandlike skin rash. Other than that she is doing well.  REVIEW OF SYSTEMS:    Constitutional: Denies fevers, chills or abnormal weight loss Eyes: Denies blurriness of vision Ears, nose, mouth, throat, and face: Denies mucositis or sore throat Respiratory: Denies cough, dyspnea or wheezes Cardiovascular: Denies palpitation, chest discomfort or lower extremity swelling Gastrointestinal:  Denies nausea, heartburn or change in bowel habits Skin: Radiation dermatitis Lymphatics: Denies new lymphadenopathy or easy bruising Neurological:Denies numbness, tingling or new weaknesses Behavioral/Psych: Mood is stable, no new changes  All other systems were reviewed with the patient and are negative.  I have reviewed the past medical history, past surgical history, social history and family history with the patient and they are unchanged from previous note.  ALLERGIES:  is allergic to sulfa antibiotics; oxycodone; and tramadol.  MEDICATIONS:  Current Outpatient Prescriptions  Medication Sig Dispense Refill  . Cholecalciferol (VITAMIN D3) 5000 UNITS CAPS Take 1 capsule by mouth daily.     Marland Kitchen glucosamine-chondroitin 500-400 MG tablet Take 1 tablet by mouth daily.     . L-TYROSINE PO Take 250 mg elemental calcium/kg/hr by mouth daily.    Marland Kitchen levalbuterol (XOPENEX HFA) 45 MCG/ACT inhaler Inhale into the lungs every 4 (four) hours as needed for wheezing.    . Magnesium 500 MG CAPS Take 1 capsule by mouth daily.    . metroNIDAZOLE (METROCREAM) 0.75 % cream Apply 1 application topically daily as needed.     . Multiple Vitamins-Minerals (MULTIVITAMIN WITH MINERALS) tablet Take 1 tablet by mouth daily.    . non-metallic deodorant Jethro Poling) MISC Apply 1 application topically daily as needed.    Marland Kitchen OVER THE COUNTER MEDICATION Take 100 mg by mouth daily. 5 HTP    . OVER THE COUNTER  MEDICATION Take 400 mg by mouth daily. SAMe    . OVER THE COUNTER MEDICATION Take 500 mg by mouth daily. methionine    . Probiotic Product (ACIDOPHILUS/GOAT MILK) CAPS Take 1 capsule by mouth daily.     . Thyroid  (NATURE-THROID PO) Take 16 mg by mouth daily. 1/4 grain = 16.2 mg.    . Wound Cleansers (RADIAPLEX EX) Apply topically.     No current facility-administered medications for this visit.    PHYSICAL EXAMINATION: ECOG PERFORMANCE STATUS: 1 - Symptomatic but completely ambulatory  Filed Vitals:   01/08/15 1513  BP: 120/76  Pulse: 60  Temp: 98.1 F (36.7 C)  Resp: 18   Filed Weights   01/08/15 1513  Weight: 127 lb 11.2 oz (57.924 kg)    GENERAL:alert, no distress and comfortable SKIN: skin color, texture, turgor are normal, no rashes or significant lesions EYES: normal, Conjunctiva are pink and non-injected, sclera clear OROPHARYNX:no exudate, no erythema and lips, buccal mucosa, and tongue normal  NECK: supple, thyroid normal size, non-tender, without nodularity LYMPH:  no palpable lymphadenopathy in the cervical, axillary or inguinal LUNGS: clear to auscultation and percussion with normal breathing effort HEART: regular rate & rhythm and no murmurs and no lower extremity edema ABDOMEN:abdomen soft, non-tender and normal bowel sounds Musculoskeletal:no cyanosis of digits and no clubbing  NEURO: alert & oriented x 3 with fluent speech, no focal motor/sensory deficits  LABORATORY DATA:  I have reviewed the data as listed   Chemistry      Component Value Date/Time   NA 143 10/10/2014 1251   K 4.0 10/10/2014 1251   CO2 31* 10/10/2014 1251   BUN 14.7 10/10/2014 1251   CREATININE 1.0 10/10/2014 1251      Component Value Date/Time   CALCIUM 10.1 10/10/2014 1251   ALKPHOS 71 10/10/2014 1251   AST 18 10/10/2014 1251   ALT 9 10/10/2014 1251   BILITOT 0.43 10/10/2014 1251       Lab Results  Component Value Date   WBC 5.5 10/31/2014   HGB 13.2 10/31/2014   HCT 38.8 10/31/2014   MCV 86.0 10/31/2014   PLT 176 10/31/2014   NEUTROABS 4.7 10/10/2014   ASSESSMENT & PLAN:  Breast cancer of upper-outer quadrant of right female breast Right lumpectomy 11/01/2014: IDC grade 1,  1.8 cm, associated intermediate grade DCIS, LCIS, posterior margin less than 0.2 cm for IDC plus DCIS, 0/2 lymph nodes; ER 95%, PR 50%, HER-2 negative ratio 1.3, Ki-67 5%, T1 cN0 stage IA, Oncotype DX 23, 15% risk of recurrence, currently on adjuvant radiation therapy  Plan: 1. Anti-estrogen therapy with anastrozole 1 mg daily 5 years  Anastrozole counseling: We discussed the risks and benefits of anti-estrogen therapy with aromatase inhibitors. These include but not limited to insomnia, hot flashes, mood changes, vaginal dryness, bone density loss, and weight gain. Although rare, serious side effects including endometrial cancer, risk of blood clots were also discussed. We strongly believe that the benefits far outweigh the risks. Patient understands these risks and consented to starting treatment. Planned treatment duration is 5 years.  Return to clinic in one month to assess tolerability to treatment  No orders of the defined types were placed in this encounter.   The patient has a good understanding of the overall plan. she agrees with it. she will call with any problems that may develop before the next visit here.   Rulon Eisenmenger, MD

## 2015-01-08 NOTE — Telephone Encounter (Signed)
Appointments made and avs printed for patient °

## 2015-01-09 ENCOUNTER — Ambulatory Visit
Admission: RE | Admit: 2015-01-09 | Discharge: 2015-01-09 | Disposition: A | Discharge status: Home / Self Care | Payer: Managed Care, Other (non HMO) | Source: Ambulatory Visit | Attending: Radiation Oncology | Admitting: Radiation Oncology

## 2015-01-09 ENCOUNTER — Ambulatory Visit: Payer: Managed Care, Other (non HMO)

## 2015-01-09 DIAGNOSIS — Z510 Encounter for antineoplastic radiation therapy: Secondary | ICD-10-CM | POA: Diagnosis not present

## 2015-01-09 DIAGNOSIS — C50411 Malignant neoplasm of upper-outer quadrant of right female breast: Secondary | ICD-10-CM

## 2015-01-09 MED ORDER — RADIAPLEXRX EX GEL
Freq: Once | CUTANEOUS | Status: AC
Start: 2015-01-09 — End: 2015-01-09
  Administered 2015-01-09: 15:00:00 via TOPICAL

## 2015-01-10 ENCOUNTER — Ambulatory Visit: Payer: Managed Care, Other (non HMO) | Admitting: Radiation Oncology

## 2015-01-10 ENCOUNTER — Telehealth: Payer: Self-pay | Admitting: Genetic Counselor

## 2015-01-10 ENCOUNTER — Ambulatory Visit: Payer: Managed Care, Other (non HMO)

## 2015-01-10 NOTE — Telephone Encounter (Signed)
Ms. Rissler is calling because she does not know what to do with this second appeal letter that she has received from the insurance company.  We reviewed that this is not a bill from the lab, but I gave her the phone number for [facility] so she could speak with someone directly about this document and her expense for genetic testing.

## 2015-01-14 ENCOUNTER — Encounter: Payer: Self-pay | Admitting: Radiation Oncology

## 2015-01-15 ENCOUNTER — Ambulatory Visit: Payer: Managed Care, Other (non HMO)

## 2015-01-15 ENCOUNTER — Other Ambulatory Visit: Payer: Self-pay | Admitting: Adult Health

## 2015-01-15 DIAGNOSIS — C50411 Malignant neoplasm of upper-outer quadrant of right female breast: Secondary | ICD-10-CM

## 2015-01-15 NOTE — Progress Notes (Signed)
  Radiation Oncology         [phone]) [phone] ________________________________  Name: Tracey Mckinney MRN: [id number]  Date: 01/14/2015  DOB: 1961/04/11  End of Treatment Note  Diagnosis:   Pathologic T1cN0 clinical M0, Stage I, Right breast invasive Grade 1 ductal carcinoma with DCIS and LCIS, ER and PR +, HER-2 (-)      Indication for treatment:  curative       Radiation treatment dates:  12-11-14 to 01-09-15  Site/dose:  1) Right breast / 40.05 Gy in 15 fractions 2) Right breast boost / 10 Gy in 5 fractions  Beams/energy:   1) 3D tangents / 6MV 2) Electron boost / 9 MeV  Narrative: The patient tolerated radiation treatment relatively well.      Plan: The patient has completed radiation treatment. The patient will return to radiation oncology clinic for routine followup in one month. I advised them to call or return sooner if they have any questions or concerns related to their recovery or treatment.  -----------------------------------  Eppie Gibson, MD

## 2015-01-17 ENCOUNTER — Other Ambulatory Visit: Payer: Self-pay | Admitting: *Deleted

## 2015-01-17 ENCOUNTER — Ambulatory Visit: Payer: Managed Care, Other (non HMO) | Admitting: Dietician

## 2015-01-17 DIAGNOSIS — C50411 Malignant neoplasm of upper-outer quadrant of right female breast: Secondary | ICD-10-CM

## 2015-01-18 ENCOUNTER — Telehealth: Payer: Self-pay | Admitting: *Deleted

## 2015-01-18 NOTE — Telephone Encounter (Signed)
Left vm for pt to return call regarding needs after xrt. Contact information given.

## 2015-01-22 ENCOUNTER — Telehealth: Payer: Self-pay | Admitting: *Deleted

## 2015-01-22 NOTE — Telephone Encounter (Signed)
Spoke to pt and congratulate on completion of xrt. Relate doing well and denies complaints. Encourage pt to call with questions or needs. Confirmed future appts.

## 2015-01-25 NOTE — Progress Notes (Signed)
Electron Holiday representative Note  Diagnosis: Breast Cancer   The patient's CT images from her initial simulation were reviewed to plan her boost treatment to her Right breast  lumpectomy cavity.  Measurements were made regarding the size and depth of the surgical bed. The boost to the lumpectomy cavity will be delivered with 9 MeV electrons; 10 Gy in 5 fractions has been prescribed to the 100% isodose line.   An electron Best boy was reviewed and approved.  A custom electron cut-out will be used for her boost field.    -----------------------------------  Eppie Gibson, MD

## 2015-02-15 ENCOUNTER — Telehealth: Payer: Self-pay | Admitting: Hematology and Oncology

## 2015-02-15 ENCOUNTER — Ambulatory Visit
Admission: RE | Admit: 2015-02-15 | Discharge: 2015-02-15 | Disposition: A | Discharge status: Home / Self Care | Payer: Managed Care, Other (non HMO) | Source: Ambulatory Visit | Attending: Radiation Oncology | Admitting: Radiation Oncology

## 2015-02-15 ENCOUNTER — Encounter: Payer: Self-pay | Admitting: Radiation Oncology

## 2015-02-15 VITALS — BP 114/74 | HR 57 | Temp 98.0°F | Ht 66.0 in | Wt 131.8 lb

## 2015-02-15 DIAGNOSIS — C50411 Malignant neoplasm of upper-outer quadrant of right female breast: Secondary | ICD-10-CM

## 2015-02-15 NOTE — Telephone Encounter (Signed)
Patient came by to r/s her survivorship appt

## 2015-02-15 NOTE — Progress Notes (Signed)
Radiation Oncology         [phone]) [phone] ________________________________  Name: Tracey Mckinney MRN: [id number]  Date: 02/15/2015  DOB: [birth year]  Follow-Up Visit Note  Outpatient  CC: Mylinda Latina, MD  Arnetha Gula, MD  Diagnosis and Prior Radiotherapy:    ICD-9-CM ICD-10-CM   1. Breast cancer of upper-outer quadrant of right female breast ([place]) 174.4 C50.411     Pathologic T1cN0 clinical M0, Stage I, Right breast invasive Grade 1 ductal carcinoma with DCIS and LCIS, ER+ and PR+, HER-2(-)  Radiation treatment dates:  12-11-14 to 01-09-15 Site/dose:  1) Right breast / 40.05 Gy in 15 fractions 2) Right breast boost / 10 Gy in 5 fractions  Narrative:  The patient returns today for routine follow-up. She has started taking anastrozole and has notices some depression along with this. She is taking 5-HTP as a supplement to help with this, which has worked in the past and is helping now. She still reports some fatigue, but feels like this is improving and she is now working 3 days a week. She has some hyperpigmentation to her left breast and around her nipple. She reports some tenderness around her nipple and she is using the radiaplex cream once or twice a week.  ALLERGIES:  is allergic to sulfa antibiotics; oxycodone; and tramadol.  Meds: Current Outpatient Prescriptions  Medication Sig Dispense Refill  . anastrozole (ARIMIDEX) 1 MG tablet Take 1 tablet (1 mg total) by mouth daily. 30 tablet 0  . Cholecalciferol (VITAMIN D3) 5000 UNITS CAPS Take 1 capsule by mouth daily.     Marland Kitchen glucosamine-chondroitin 500-400 MG tablet Take 1 tablet by mouth daily.     . L-TYROSINE PO Take 250 mg elemental calcium/kg/hr by mouth daily.    Marland Kitchen levalbuterol (XOPENEX HFA) 45 MCG/ACT inhaler Inhale into the lungs every 4 (four) hours as needed for wheezing.    . Magnesium 500 MG CAPS Take 1 capsule by mouth daily.    . metroNIDAZOLE (METROCREAM) 0.75 % cream Apply 1 application topically daily as  needed.     . Multiple Vitamins-Minerals (MULTIVITAMIN WITH MINERALS) tablet Take 1 tablet by mouth daily.    . non-metallic deodorant Jethro Poling) MISC Apply 1 application topically daily as needed.    Marland Kitchen OVER THE COUNTER MEDICATION Take 400 mg by mouth daily. SAMe    . OVER THE COUNTER MEDICATION Take 500 mg by mouth daily. methionine    . Probiotic Product (ACIDOPHILUS/GOAT MILK) CAPS Take 1 capsule by mouth daily.     . Thyroid (NATURE-THROID PO) Take 16 mg by mouth daily. 1/4 grain = 16.2 mg.    . Wound Cleansers (RADIAPLEX EX) Apply topically.    Marland Kitchen OVER THE COUNTER MEDICATION Take 200 mg by mouth daily. 5 HTP     No current facility-administered medications for this encounter.    Physical Findings: The patient is in no acute distress. Patient is alert and oriented.  height is 5' 6" (1.676 m) and weight is 131 lb 12.8 oz (59.784 kg). Her temperature is 98 F (36.7 C). Her blood pressure is 114/74 and her pulse is 57.   Residual hyperpigmentation over the right breast with resolving desquamation over the right nipple.   Lab Findings: Lab Results  Component Value Date   WBC 5.5 10/31/2014   HGB 13.2 10/31/2014   HCT 38.8 10/31/2014   MCV 86.0 10/31/2014   PLT 176 10/31/2014    Radiographic Findings: No results found.  Impression/Plan: healing well from RT.  Apply Vit E oil to breast for more healing. I encouraged her to continue with yearly mammography and followup with medical oncology. I will see her back on an as-needed basis. I have encouraged her to call if she has any issues or concerns in the future. I wished her the very best.   This document serves as a record of services personally performed by Eppie Gibson, MD. It was created on her behalf by Darcus Austin, a trained medical scribe. The creation of this record is based on the scribe's personal observations and the provider's statements to them. This document has been checked and approved by the attending  provider. _____________________________________   Eppie Gibson, MD

## 2015-02-15 NOTE — Progress Notes (Signed)
Tracey Mckinney is here for follow-up of radiation to her Right Breast completed 01/09/15. She has started taking anastrozole and has notices some depression along with this. She is taking 5HTP as a supplement to help with this, which has worked in the past.  She reports some fatigue still, but feels like this is improving and she is now working 3 days a week. She has some hyperpigmentation to her Left Anterior Breast and around her nipple. She reports some tenderness around her nipple, and she is using the radiaplex cream once or twice a week.   BP 114/74 mmHg  Pulse 57  Temp(Src) 98 F (36.7 C)  Ht 5\' 6"  (1.676 m)  Wt 131 lb 12.8 oz (59.784 kg)  BMI 21.28 kg/m2

## 2015-03-04 ENCOUNTER — Telehealth: Payer: Self-pay | Admitting: Hematology and Oncology

## 2015-03-04 ENCOUNTER — Encounter: Payer: Self-pay | Admitting: Hematology and Oncology

## 2015-03-04 ENCOUNTER — Ambulatory Visit (HOSPITAL_BASED_OUTPATIENT_CLINIC_OR_DEPARTMENT_OTHER): Payer: Managed Care, Other (non HMO) | Admitting: Hematology and Oncology

## 2015-03-04 VITALS — BP 104/67 | HR 57 | Temp 98.0°F | Resp 19 | Ht 66.0 in | Wt 131.0 lb

## 2015-03-04 DIAGNOSIS — C50411 Malignant neoplasm of upper-outer quadrant of right female breast: Secondary | ICD-10-CM | POA: Diagnosis not present

## 2015-03-04 DIAGNOSIS — M791 Myalgia: Secondary | ICD-10-CM | POA: Diagnosis not present

## 2015-03-04 MED ORDER — ANASTROZOLE 1 MG PO TABS: 1.0000 mg | ORAL_TABLET | Freq: Every day | ORAL | Status: DC

## 2015-03-04 NOTE — Addendum Note (Signed)
Addended by: Prentiss Bells on: 03/04/2015 04:21 PM   Modules accepted: Orders, Medications

## 2015-03-04 NOTE — Progress Notes (Signed)
Patient Care Team: Arnetha Gula, MD as PCP - General (Family Medicine) Erroll Luna, MD as Consulting Physician (General Surgery) Nicholas Lose, MD as Consulting Physician (Hematology and Oncology) Gery Pray, MD as Consulting Physician (Radiation Oncology) Mauro Kaufmann, RN as Registered Nurse Rockwell Germany, RN as Registered Nurse Holley Bouche, NP as Nurse Practitioner (Nurse Practitioner) Meta Hatchet, LCSW as Social Worker  DIAGNOSIS: Breast cancer of upper-outer quadrant of right female breast [facility])   Staging form: Breast, AJCC 7th Edition     Clinical stage from 10/10/2014: Stage IA (T1c, N0, M0) - Unsigned   SUMMARY OF ONCOLOGIC HISTORY:   Breast cancer of upper-outer quadrant of right female breast ([place])   09/28/2014 Initial Diagnosis Right breast needle biopsy 10:00 position: 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%, 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%, 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%, HER-2 negative, grade 1; right breast biopsy 10:00 2 cm from nipple: Fibrocystic changes with usual ductal hyperplasia   09/28/2014 Mammogram Mammogram and ultrasound suspicious irregular mass at 10:00 position right breast 1.8 cm in addition 3 small nodules 0.5, 0.6, 0.5 cm biopsy proven to be benign fibrocystic changes   11/01/2014 Surgery Right lumpectomy: IDC grade 1, 1.8 cm, associated intermediate grade DCIS, LCIS, posterior margin less than 0.2 cm for IDC plus DCIS, 0/2 lymph nodes; ER 95%, PR 50%, HER-2 negative ratio 1.3, Ki-67 5%, T1 cN0 stage IA Oncotype 23, 16% ROR   12/10/2014 - 01/09/2015 Radiation Therapy Adjuvant radiation therapy   02/01/2015 -  Anti-estrogen oral therapy Anastrozole 1 mg daily stopped 03/04/2015 due to cloudiness of the head    CHIEF COMPLIANT: follow-up on anastrozole  INTERVAL HISTORY: Tracey Mckinney is a 54 year old with above-mentioned history of right breast cancer currently on adjuvant antiestrogen therapy with anastrozole. She reports that since she started taking anastrozole, she has  experienced some cloudiness in the head difficulty with processing of complex tasks as well as some mood changes and depression. She also had muscle aches and pains somewhat the symptoms had improved after taking it for a short period of time. She is very worried about these symptoms.  REVIEW OF SYSTEMS:   Constitutional: Denies fevers, chills or abnormal weight loss Eyes: Denies blurriness of vision Ears, nose, mouth, throat, and face: Denies mucositis or sore throat Respiratory: Denies cough, dyspnea or wheezes Cardiovascular: Denies palpitation, chest discomfort or lower extremity swelling Gastrointestinal:  Denies nausea, heartburn or change in bowel habits Skin: Denies abnormal skin rashes Lymphatics: Denies new lymphadenopathy or easy bruising Neurological:Denies numbness, tingling or new weaknesses Behavioral/Psych: depression Breast:  denies any pain or lumps or nodules in either breasts All other systems were reviewed with the patient and are negative.  I have reviewed the past medical history, past surgical history, social history and family history with the patient and they are unchanged from previous note.  ALLERGIES:  is allergic to sulfa antibiotics; oxycodone; and tramadol.  MEDICATIONS:  Current Outpatient Prescriptions  Medication Sig Dispense Refill  . [START ON 04/17/2015] anastrozole (ARIMIDEX) 1 MG tablet Take 1 tablet (1 mg total) by mouth daily. 90 tablet 3  . Cholecalciferol (VITAMIN D3) 5000 UNITS CAPS Take 1 capsule by mouth daily.     Marland Kitchen glucosamine-chondroitin 500-400 MG tablet Take 1 tablet by mouth daily.     . L-TYROSINE PO Take 250 mg elemental calcium/kg/hr by mouth daily.    Marland Kitchen levalbuterol (XOPENEX HFA) 45 MCG/ACT inhaler Inhale into the lungs every 4 (four) hours as needed for wheezing.    . Magnesium  500 MG CAPS Take 1 capsule by mouth daily.    . metroNIDAZOLE (METROCREAM) 0.75 % cream Apply 1 application topically daily as needed.     . Multiple  Vitamins-Minerals (MULTIVITAMIN WITH MINERALS) tablet Take 1 tablet by mouth daily.    . non-metallic deodorant Jethro Poling) MISC Apply 1 application topically daily as needed.    Marland Kitchen OVER THE COUNTER MEDICATION Take 200 mg by mouth daily. 5 HTP    . OVER THE COUNTER MEDICATION Take 400 mg by mouth daily. SAMe    . OVER THE COUNTER MEDICATION Take 500 mg by mouth daily. methionine    . Probiotic Product (ACIDOPHILUS/GOAT MILK) CAPS Take 1 capsule by mouth daily.     . Thyroid (NATURE-THROID PO) Take 16 mg by mouth daily. 1/4 grain = 16.2 mg.    . Wound Cleansers (RADIAPLEX EX) Apply topically.     No current facility-administered medications for this visit.    PHYSICAL EXAMINATION: ECOG PERFORMANCE STATUS: 1 - Symptomatic but completely ambulatory  Filed Vitals:   03/04/15 1037  BP: 104/67  Pulse: 57  Temp: 98 F (36.7 C)  Resp: 19   Filed Weights   03/04/15 1037  Weight: 131 lb (59.421 kg)    GENERAL:alert, no distress and comfortable SKIN: skin color, texture, turgor are normal, no rashes or significant lesions EYES: normal, Conjunctiva are pink and non-injected, sclera clear OROPHARYNX:no exudate, no erythema and lips, buccal mucosa, and tongue normal  NECK: supple, thyroid normal size, non-tender, without nodularity LYMPH:  no palpable lymphadenopathy in the cervical, axillary or inguinal LUNGS: clear to auscultation and percussion with normal breathing effort HEART: regular rate & rhythm and no murmurs and no lower extremity edema ABDOMEN:abdomen soft, non-tender and normal bowel sounds Musculoskeletal:no cyanosis of digits and no clubbing  NEURO: alert & oriented x 3 with fluent speech, no focal motor/sensory deficits   LABORATORY DATA:  I have reviewed the data as listed   Chemistry      Component Value Date/Time   NA 143 10/10/2014 1251   K 4.0 10/10/2014 1251   CO2 31* 10/10/2014 1251   BUN 14.7 10/10/2014 1251   CREATININE 1.0 10/10/2014 1251      Component Value  Date/Time   CALCIUM 10.1 10/10/2014 1251   ALKPHOS 71 10/10/2014 1251   AST 18 10/10/2014 1251   ALT 9 10/10/2014 1251   BILITOT 0.43 10/10/2014 1251       Lab Results  Component Value Date   WBC 5.5 10/31/2014   HGB 13.2 10/31/2014   HCT 38.8 10/31/2014   MCV 86.0 10/31/2014   PLT 176 10/31/2014   NEUTROABS 4.7 10/10/2014   ASSESSMENT & PLAN:  Breast cancer of upper-outer quadrant of right female breast Right lumpectomy 11/01/2014: IDC grade 1, 1.8 cm, associated intermediate grade DCIS, LCIS, posterior margin less than 0.2 cm for IDC plus DCIS, 0/2 lymph nodes; ER 95%, PR 50%, HER-2 negative ratio 1.3, Ki-67 5%, T1 cN0 stage IA, Oncotype DX 23, 15% risk of recurrence, currently on adjuvant radiation therapy, started anastrozole 1 mg daily 02/01/2015 stopped 03/04/2015, to be resumed January 2017  Anastrozole toxicities: 1. Mood changes and feelings of depression 2. Myalgias 3. Slowness to processing complex issues  I recommended holding anastrozole until January and then resuming it in the morning. This will allow her to recover fully from the effects of radiation. I also encouraged her to increase her physical activity and do exercises regularly so that the cloudiness in the mind could improve.  Surveillance plan: 1. Annual mammograms 2. Clinical breast exams every 6 months  Return to clinic in March for follow-up to assess tolerability to anastrozole  No orders of the defined types were placed in this encounter.   The patient has a good understanding of the overall plan. she agrees with it. she will call with any problems that may develop before the next visit here.   Rulon Eisenmenger, MD 03/04/2015

## 2015-03-04 NOTE — Assessment & Plan Note (Signed)
Right lumpectomy 11/01/2014: IDC grade 1, 1.8 cm, associated intermediate grade DCIS, LCIS, posterior margin less than 0.2 cm for IDC plus DCIS, 0/2 lymph nodes; ER 95%, PR 50%, HER-2 negative ratio 1.3, Ki-67 5%, T1 cN0 stage IA, Oncotype DX 23, 15% risk of recurrence, currently on adjuvant radiation therapy, started anastrozole 1 mg daily 02/01/2015  Anastrozole toxicities:  Surveillance plan: 1. Annual mammograms 2. Clinical breast exams every 6 months  Return to clinic in 6 months for follow-up

## 2015-03-04 NOTE — Telephone Encounter (Signed)
Appointments made and avs printed for patient °

## 2015-03-12 ENCOUNTER — Other Ambulatory Visit: Payer: Self-pay | Admitting: *Deleted

## 2015-03-12 DIAGNOSIS — C50411 Malignant neoplasm of upper-outer quadrant of right female breast: Secondary | ICD-10-CM

## 2015-03-12 MED ORDER — ANASTROZOLE 1 MG PO TABS: 1.0000 mg | ORAL_TABLET | Freq: Every day | ORAL | Status: DC

## 2015-03-21 ENCOUNTER — Ambulatory Visit (HOSPITAL_BASED_OUTPATIENT_CLINIC_OR_DEPARTMENT_OTHER): Payer: Managed Care, Other (non HMO) | Admitting: Nurse Practitioner

## 2015-03-21 ENCOUNTER — Encounter: Payer: Self-pay | Admitting: Nurse Practitioner

## 2015-03-21 VITALS — BP 131/67 | HR 58 | Temp 98.2°F | Resp 18 | Ht 66.0 in | Wt 130.8 lb

## 2015-03-21 DIAGNOSIS — Z853 Personal history of malignant neoplasm of breast: Secondary | ICD-10-CM | POA: Diagnosis not present

## 2015-03-21 DIAGNOSIS — M899 Disorder of bone, unspecified: Secondary | ICD-10-CM

## 2015-03-21 DIAGNOSIS — C50411 Malignant neoplasm of upper-outer quadrant of right female breast: Secondary | ICD-10-CM

## 2015-03-21 NOTE — Progress Notes (Signed)
CLINIC:  Cancer Survivorship   REASON FOR VISIT:  Routine follow-up post-treatment for a recent history of breast cancer.  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%, Ki-67 5%, HER-2 negative, grade 1; right breast biopsy 10:00 2 cm from nipple: Fibrocystic changes with usual ductal hyperplasia   09/28/2014 Clinical Stage Stage IA: T1c N0   11/01/2014 Definitive Surgery Right lumpectomy: IDC grade 1, 1.8 cm, associated intermediate grade DCIS, LCIS, posterior margin less than 0.2 cm for IDC plus DCIS, 0/2 lymph nodes; ER 95%, PR 50%, HER-2 negative ratio 1.3, Ki-67 5%   11/01/2014 Pathologic Stage Stage IA: pT1c pN0   11/01/2014 Oncotype testing Oncotype 23, 16% ROR   11/08/2014 Procedure Breast/Ovarian ([facility]); reveals no clinically significant variant at ATM, BARD1, BRCA1, BRCA2, BRIP1, CDH1, CHEK2, FANCC, MLH1, MSH2, MSH6, NBN, PALB2, PMS2, PTEN, RAD51C, RAD51D, TP53, and XRCC2   12/11/2014 - 01/09/2015 Radiation Therapy Adjuvant radiation therapy Isidore Moos): 1) Right breast / 40.05 Gy in 15 fractions 2) Right breast boost / 10 Gy in 5 fractions    02/01/2015 -  Anti-estrogen oral therapy Anastrozole 1 mg daily stopped 03/04/2015 due to cloudiness of the head. Attempt retry in January 2017    INTERVAL HISTORY:  Ms. Farina presents to the [facility] today for our initial meeting to review her survivorship care plan detailing her treatment course for breast cancer, as well as monitoring long-term side effects of that treatment, education regarding health maintenance, screening, and overall wellness and health promotion.      Overall, Ms. Oplinger reports feeling quite well since completing her radiation therapy approximately two months ago.  She reports the fatigue has improved somewhat as have the changes overlying the skin over her right breast. She denies any change in either breast, specifically denying any mass or nodule.  She has had no headache, cough, shortness of breath or bone pain.  She has a good appetite and denies weight loss.  Of note, she does have extensive food allergies / intolerances.  She reports that she noticed some "brain fogginess" while taking the anastrozole which vastly limited her ability to function.  She discussed this with Dr. Lindi Adie, who advised a drug holiday. She reports that these symptoms have resolved and she is still trying to decide whether she wants to resume therapy.     REVIEW OF SYSTEMS:  General: Denies fever, chills, or night sweats.  HEENT: Denies visual changes(h/o vitreous detachment), hearing loss, mouth sores or difficulty swallowing. Cardiac: Denies palpitations, chest pain, and lower extremity edema.  Respiratory: Denies wheeze or dyspnea on exertion.  Breast: Denies any new nodularity, masses, tenderness, nipple changes, or nipple discharge.  GI: Intermittent constipation and heartburn.  Otherwise, denies diarrhea, nausea, or vomiting.  GU: Denies dysuria, hematuria, vaginal bleeding, vaginal discharge, or vaginal dryness.  Musculoskeletal: Denies joint or bone pain.  Neuro: Denies recent fall or numbness / tingling in her extremities. Skin: Denies rash, pruritis, or open wounds.  Psych: Occasional anxiety and depression.  Denies insomnia, or memory loss.   A 14-point review of systems was completed and was negative, except as noted above.   ONCOLOGY TREATMENT TEAM:  1. Surgeon:  Dr. Barry Dienes at [facility]  2. Medical Oncologist: Dr. Lindi Adie 3. Radiation Oncologist: Dr. Isidore Moos    PAST MEDICAL/SURGICAL HISTORY:  Past Medical History  Diagnosis  Date  . Chronic fatigue   . Thyroid disease   . Gallstones   . Arthritis   . PONV (postoperative nausea and vomiting)   . Asthma     last flareup about a month ago--usually when it rains  . Hypothyroidism   . Depression   . ADD (attention deficit disorder)   . Wheat allergy     yeast & dairy  . Breast cancer ([place]) 2016    IDC+DCIS+LCIS of UOQ of Right breast; ER/PR+, Her2-   Past Surgical History  Procedure Laterality Date  . Refractive surgery    . Eye surgery    . Breast surgery      right breast bx done in june 2-16  . Breast lumpectomy with radioactive seed and sentinel lymph node biopsy Right 11/01/2014    Procedure: BREAST LUMPECTOMY WITH RADIOACTIVE SEED AND SENTINEL LYMPH NODE BIOPSY;  Surgeon: [name], MD;  Location: [place];  Service: General;  Laterality: Right;  . Cholecystectomy N/A 11/01/2014    Procedure: LAPAROSCOPIC CHOLECYSTECTOMY;  Surgeon: [name], MD;  Location: [place];  Service: General;  Laterality: N/A;  . Eye surgery Right     vitrious detachment and hemorhage     ALLERGIES:  Allergies  Allergen Reactions  . Sulfa Antibiotics     Other reaction(s): Other Cannot remember hasn't taken in 20 years.  . Oxycodone Itching  . Tramadol Rash    Also had itching and numbness of face and hands     CURRENT MEDICATIONS:  Current Outpatient Prescriptions on File Prior to Visit  Medication Sig Dispense Refill  . Cholecalciferol (VITAMIN D3) 5000 UNITS CAPS Take 1 capsule by mouth daily.     Marland Kitchen glucosamine-chondroitin 500-400 MG tablet Take 1 tablet by mouth daily.     . L-TYROSINE PO Take 250 mg elemental calcium/kg/hr by mouth daily.    . Magnesium 500 MG CAPS Take 1 capsule by mouth daily.    . Multiple Vitamins-Minerals (MULTIVITAMIN WITH MINERALS) tablet Take 1 tablet by mouth daily.    . non-metallic deodorant Jethro Poling) MISC Apply 1 application topically daily as needed.    . Probiotic Product (ACIDOPHILUS/GOAT MILK) CAPS Take 1 capsule by mouth  daily.     . Thyroid (NATURE-THROID PO) Take 16 mg by mouth daily. 1/4 grain = 16.2 mg.    . Derrill Memo ON 04/17/2015] anastrozole (ARIMIDEX) 1 MG tablet Take 1 tablet (1 mg total) by mouth daily. (Patient not taking: Reported on 03/21/2015) 90 tablet 3  . levalbuterol (XOPENEX HFA) 45 MCG/ACT inhaler Inhale into the lungs every 4 (four) hours as needed for wheezing.    . metroNIDAZOLE (METROCREAM) 0.75 % cream Apply 1 application topically daily as needed.      No current facility-administered medications on file prior to visit.     ONCOLOGIC FAMILY HISTORY:  Family History  Problem Relation Age of Onset  . Breast cancer Mother   . 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  She has 2 children.  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  She denies any current or history of tobacco, alcohol, or illicit drug use.     PHYSICAL EXAMINATION:  Vital Signs: Filed Vitals:   03/21/15 1508  BP: 131/67  Pulse: 58  Temp: 98.2 F (36.8 C)  Resp: 18   ECOG Performance Status: 0  General: Well-nourished, well-appearing female in no acute distress.  She is unaccompanied in clinic  today.   HEENT: Head is atraumatic and normocephalic.  Pupils equal and reactive to light and accomodation. Conjunctivae clear without exudate.  Sclerae anicteric. Oral mucosa is pink, moist, and intact without lesions.  Oropharynx is pink without lesions or erythema.  Lymph: No cervical, supraclavicular, infraclavicular, or axillary lymphadenopathy noted on palpation.  Cardiovascular: Regular rate and rhythm without murmurs, rubs, or gallops. Respiratory: Clear to  auscultation bilaterally. Chest expansion symmetric without accessory muscle use on inspiration or expiration.  GI: Abdomen soft and round. No tenderness to palpation. Bowel sounds normoactive in 4 quadrants. GU: Deferred.  Musculoskeletal: Muscle strength 5/5 in all extremities.   Neuro: No focal deficits. Steady gait.  Psych: Mood and affect normal and appropriate for situation.  Extremities: No edema, cyanosis, or clubbing.  Skin: Warm and dry. No open lesions noted.   LABORATORY DATA:  None for this visit.  DIAGNOSTIC IMAGING:  None for this visit.     ASSESSMENT AND PLAN:   1. History of breast cancer: Stage IA invasive ductal carcinoma of the right breast, grade 1, ER positive, PR positive, HER2/neu negative, S/P lumpectomy followed by adjuvant radiation therapy now on adjuvant endocrine therapy with anastrozole.  Ms. Boehme is doing well without clinical symptoms worrisome for disease recurrence. She will follow-up with her medical oncologist,  Dr. Lindi Adie, in March 2016 with history and physical examination per surveillance protocol.  She had been experiencing toxicities of mental confusion / "brain fog" on the anastrozole and is currently in the midst of a drug holiday off the medication. She reports that the symptoms have improved off medication and she is currently contemplating whether she wants to resume the medication. She will discuss this further with Drs. Parks Neptune prior to her follow up appointment with Dr. Lindi Adie in March.  We discussed possible plans including attempting a trial of another aromatase inhibitor vs. changing to tamoxifen vs. discontinuing endocrine therapy. A comprehensive survivorship care plan and treatment summary was reviewed with the patient today detailing her breast cancer diagnosis, treatment course, potential late/long-term effects of treatment, appropriate follow-up care with recommendations for the future, and patient education resources.  A  copy of this summary, along with a letter will be sent to the patient's primary care provider via in basket message after today's visit.  Ms. Kasler is welcome to return to the Survivorship Clinic in the future, as needed; no follow-up will be scheduled at this time.    2. Bone health:  Given Ms. Hunton's age/history of breast cancer and her current treatment regimen including endocrine therapy with anastrozole, she is at risk for bone demineralization.  Per our records, her last DEXA scan was performed in August 2016 revealing osteopenia. We will continue to monitor this closely while she is on endocrine therapy.  In the meantime, she was encouraged to increase her consumption of foods rich in calcium and vitamin D as well as to increase her weight-bearing activities.  She was given education on specific activities to promote bone health.  3. Cancer screening:  Due to Ms. Dowd's  history and her age, she should receive screening for skin cancers, colon cancer, and gynecologic cancers.  The information and recommendations are listed on the patient's comprehensive care plan/treatment summary and were reviewed in detail with the patient.    4. Health maintenance and wellness promotion: Ms. Marton was encouraged to consume 5-7 servings of fruits and vegetables per day. We reviewed the "Nutrition Rainbow" handout, as well as discussed recommendations to maximize nutrition and minimize recurrence, such as increased intake of fruits, vegetables, lean proteins, and minimizing the intake of red meats and processed foods.  She was also encouraged to engage in moderate to vigorous exercise for 30 minutes per day most days of the week. We discussed the [employer] fitness program, which is designed for cancer survivors to help them become more physically fit after cancer treatments.  She was instructed to limit her alcohol consumption and continue to abstain from tobacco use.  A copy of the "Take Control of Your  Health" brochure was given to her reinforcing these recommendations.   5. Support services/counseling: It is not uncommon for this period of the patient's cancer care trajectory to be one of many emotions and stressors.  We discussed an opportunity for her to participate in the next session of [facility] ("Finding Your New Normal") support group series designed for patients after they have completed treatment.   Ms. Quarry was encouraged to take advantage of our many other support services programs, support groups, and/or counseling in coping with her new life as a cancer survivor after completing anti-cancer treatment.  She was offered support today through active listening and expressive supportive counseling.  She was given information regarding our available services and encouraged to contact me with any questions or for help enrolling in any of our support group/programs.    A total of 50 minutes of face-to-face time was spent with this patient with greater than 50% of that time in counseling and care-coordination.   Sylvan Cheese, NP  Survivorship Program [facility] [phone]   Note: PRIMARY CARE PROVIDER Mylinda Latina, [place] [phone]

## 2015-04-02 ENCOUNTER — Encounter: Payer: Managed Care, Other (non HMO) | Admitting: Nurse Practitioner

## 2015-05-01 ENCOUNTER — Ambulatory Visit (INDEPENDENT_AMBULATORY_CARE_PROVIDER_SITE_OTHER): Payer: Managed Care, Other (non HMO) | Admitting: Ophthalmology

## 2015-05-01 ENCOUNTER — Telehealth: Payer: Self-pay | Admitting: Genetic Counselor

## 2015-05-01 NOTE — Telephone Encounter (Signed)
Discussed with Ms. Merrow that these new documents from [place] should not be anything for her to have to take care of, and that these are probably meant for [facility].  Likely they have probably already received this documentation themselves and Christella Scheuermann has sent a copy to Ms. Jarquin.  I suggested she call [facility] to make sure, if she was still concerned.  Ms. Meservey seems reassured and I encouraged her to call me with any further questions.

## 2015-05-22 ENCOUNTER — Ambulatory Visit (INDEPENDENT_AMBULATORY_CARE_PROVIDER_SITE_OTHER): Payer: Managed Care, Other (non HMO) | Admitting: Ophthalmology

## 2015-05-22 DIAGNOSIS — H2513 Age-related nuclear cataract, bilateral: Secondary | ICD-10-CM

## 2015-05-22 DIAGNOSIS — H33303 Unspecified retinal break, bilateral: Secondary | ICD-10-CM

## 2015-05-22 DIAGNOSIS — H43813 Vitreous degeneration, bilateral: Secondary | ICD-10-CM

## 2015-05-29 ENCOUNTER — Encounter (INDEPENDENT_AMBULATORY_CARE_PROVIDER_SITE_OTHER): Payer: Managed Care, Other (non HMO) | Admitting: Ophthalmology

## 2015-05-29 DIAGNOSIS — H2513 Age-related nuclear cataract, bilateral: Secondary | ICD-10-CM | POA: Diagnosis not present

## 2015-05-29 DIAGNOSIS — H43813 Vitreous degeneration, bilateral: Secondary | ICD-10-CM

## 2015-05-29 DIAGNOSIS — H33303 Unspecified retinal break, bilateral: Secondary | ICD-10-CM

## 2015-06-23 NOTE — Assessment & Plan Note (Signed)
Right lumpectomy 11/01/2014: IDC grade 1, 1.8 cm, associated intermediate grade DCIS, LCIS, posterior margin less than 0.2 cm for IDC plus DCIS, 0/2 lymph nodes; ER 95%, PR 50%, HER-2 negative ratio 1.3, Ki-67 5%, T1 cN0 stage IA, Oncotype DX 23, 15% risk of recurrence, currently on adjuvant radiation therapy, started anastrozole 1 mg daily 02/01/2015 stopped 03/04/2015, to be resumed January 2017  Anastrozole toxicities: 1. Mood changes and feelings of depression 2. Myalgias 3. Slowness to processing complex issues  I recommended holding anastrozole until January and then resuming it in the morning. This will allow her to recover fully from the effects of radiation. I also encouraged her to increase her physical activity and do exercises regularly so that the cloudiness in the mind could improve.  Surveillance plan: 1. Annual mammograms 2. Clinical breast exams every 6 months  Return to clinic in 6 months for follow up

## 2015-06-24 ENCOUNTER — Telehealth: Payer: Self-pay | Admitting: Hematology and Oncology

## 2015-06-24 ENCOUNTER — Encounter: Payer: Self-pay | Admitting: Hematology and Oncology

## 2015-06-24 ENCOUNTER — Ambulatory Visit (HOSPITAL_BASED_OUTPATIENT_CLINIC_OR_DEPARTMENT_OTHER): Payer: Managed Care, Other (non HMO)

## 2015-06-24 ENCOUNTER — Ambulatory Visit (HOSPITAL_BASED_OUTPATIENT_CLINIC_OR_DEPARTMENT_OTHER): Payer: Managed Care, Other (non HMO) | Admitting: Hematology and Oncology

## 2015-06-24 VITALS — BP 126/82 | HR 61 | Temp 97.8°F | Resp 18 | Ht 66.0 in | Wt 134.0 lb

## 2015-06-24 DIAGNOSIS — C50411 Malignant neoplasm of upper-outer quadrant of right female breast: Secondary | ICD-10-CM

## 2015-06-24 DIAGNOSIS — R5383 Other fatigue: Secondary | ICD-10-CM | POA: Diagnosis not present

## 2015-06-24 LAB — WHOLE BLOOD GLUCOSE
Glucose: 107 mg/dL — ABNORMAL HIGH (ref 70–100)
HRS PC: 1.5 Hours

## 2015-06-24 NOTE — Telephone Encounter (Signed)
appt made and avs printed °

## 2015-06-24 NOTE — Progress Notes (Signed)
Patient Care Team: Arnetha Gula, MD as PCP - General (Family Medicine) Nicholas Lose, MD as Consulting Physician (Hematology and Oncology) Mauro Kaufmann, RN as Registered Nurse Rockwell Germany, RN as Registered Nurse Holley Bouche, NP as Nurse Practitioner (Nurse Practitioner) Meta Hatchet, LCSW as Social Worker Sylvan Cheese, NP as Nurse Practitioner (Hematology and Oncology) [name], MD as Consulting Physician (General Surgery) Eppie Gibson, MD as Attending Physician (Radiation Oncology)  DIAGNOSIS: Breast cancer of upper-outer quadrant of right female breast [facility])   Staging form: Breast, AJCC 7th Edition     Clinical stage from 10/10/2014: Stage IA (T1c, N0, M0) - Unsigned     Pathologic stage from 11/01/2014: Stage IA (T1c, N0, cM0) - Unsigned  SUMMARY OF ONCOLOGIC HISTORY:   Breast cancer of upper-outer quadrant of right female breast ([place])   09/28/2014 Mammogram Mammogram and ultrasound suspicious irregular mass at 10:00 position right breast 1.8 cm in addition 3 small nodules 0.5, 0.6, 0.5 cm biopsy proven to be benign fibrocystic changes   09/28/2014 Initial Biopsy Right breast needle biopsy 10:00 position: 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%, 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%, 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%, HER-2 negative, grade 1; right breast biopsy 10:00 2 cm from nipple: Fibrocystic changes with usual ductal hyperplasia   09/28/2014 Clinical Stage Stage IA: T1c N0   11/01/2014 Definitive Surgery Right lumpectomy: IDC grade 1, 1.8 cm, associated intermediate grade DCIS, LCIS, posterior margin less than 0.2 cm for IDC plus DCIS, 0/2 lymph nodes; ER 95%, PR 50%, HER-2 negative ratio 1.3, Ki-67 5%   11/01/2014 Pathologic Stage Stage IA: pT1c pN0   11/01/2014 Oncotype testing Oncotype 23, 16% ROR   11/08/2014 Procedure Breast/Ovarian (GeneDx); reveals no clinically significant variant at ATM, BARD1, BRCA1, BRCA2, BRIP1, CDH1, CHEK2, FANCC, MLH1, MSH2, MSH6, NBN, PALB2, PMS2, PTEN, RAD51C, RAD51D,  TP53, and XRCC2   12/11/2014 - 01/09/2015 Radiation Therapy Adjuvant radiation therapy Isidore Moos): 1) Right breast / 40.05 Gy in 15 fractions 2) Right breast boost / 10 Gy in 5 fractions    02/01/2015 -  Anti-estrogen oral therapy Anastrozole 1 mg daily stopped 03/04/2015 due to cloudiness of the head. Resumed of February 2017 every other day   03/21/2015 Survivorship Survivorship care plan completed and copy provided to patient.    CHIEF COMPLIANT: Follow-up on anastrozole  INTERVAL HISTORY: Tracey Mckinney is a 55 year old with above-mentioned history of right breast cancer currently on anastrozole. She resumed anastrozole in February 2017. She reports that she is taking it every other day. And it appears to be a lot better than previously. She still has occasional hot flashes and fatigue and myalgias but they're not as bad. She is able to tolerate it fairly well. The cloudiness in the head sensation is also much improved.  REVIEW OF SYSTEMS:   Constitutional: Denies fevers, chills or abnormal weight loss Eyes: Denies blurriness of vision Ears, nose, mouth, throat, and face: Denies mucositis or sore throat Respiratory: Denies cough, dyspnea or wheezes Cardiovascular: Denies palpitation, chest discomfort Gastrointestinal:  Denies nausea, heartburn or change in bowel habits Skin: Denies abnormal skin rashes Lymphatics: Denies new lymphadenopathy or easy bruising Neurological:Denies numbness, tingling or new weaknesses Behavioral/Psych: Mood is stable, no new changes  Extremities: No lower extremity edema Breast:  denies any pain or lumps or nodules in either breasts All other systems were reviewed with the patient and are negative.  I have reviewed the past medical history, past surgical history, social history and family history with the patient and  they are unchanged from previous note.  ALLERGIES:  is allergic to sulfa antibiotics; oxycodone; and tramadol.  MEDICATIONS:  Current Outpatient  Prescriptions  Medication Sig Dispense Refill  . anastrozole (ARIMIDEX) 1 MG tablet Take 1 tablet (1 mg total) by mouth daily. (Patient not taking: Reported on 03/21/2015) 90 tablet 3  . Cholecalciferol (VITAMIN D3) 5000 UNITS CAPS Take 1 capsule by mouth daily.     Marland Kitchen glucosamine-chondroitin 500-400 MG tablet Take 1 tablet by mouth daily.     . L-TYROSINE PO Take 250 mg elemental calcium/kg/hr by mouth daily.    Marland Kitchen levalbuterol (XOPENEX HFA) 45 MCG/ACT inhaler Inhale into the lungs every 4 (four) hours as needed for wheezing.    . Magnesium 500 MG CAPS Take 1 capsule by mouth daily.    . metroNIDAZOLE (METROCREAM) 0.75 % cream Apply 1 application topically daily as needed.     . Multiple Vitamins-Minerals (MULTIVITAMIN WITH MINERALS) tablet Take 1 tablet by mouth daily.    . non-metallic deodorant Jethro Poling) MISC Apply 1 application topically daily as needed.    . Probiotic Product (ACIDOPHILUS/GOAT MILK) CAPS Take 1 capsule by mouth daily.     . Thyroid (NATURE-THROID PO) Take 16 mg by mouth daily. 1/4 grain = 16.2 mg.     No current facility-administered medications for this visit.    PHYSICAL EXAMINATION: ECOG PERFORMANCE STATUS: 1 - Symptomatic but completely ambulatory  Filed Vitals:   06/24/15 1102  BP: 126/82  Pulse: 61  Temp: 97.8 F (36.6 C)  Resp: 18   Filed Weights   06/24/15 1102  Weight: 134 lb (60.782 kg)    GENERAL:alert, no distress and comfortable SKIN: skin color, texture, turgor are normal, no rashes or significant lesions EYES: normal, Conjunctiva are pink and non-injected, sclera clear OROPHARYNX:no exudate, no erythema and lips, buccal mucosa, and tongue normal  NECK: supple, thyroid normal size, non-tender, without nodularity LYMPH:  no palpable lymphadenopathy in the cervical, axillary or inguinal LUNGS: clear to auscultation and percussion with normal breathing effort HEART: regular rate & rhythm and no murmurs and no lower extremity edema ABDOMEN:abdomen  soft, non-tender and normal bowel sounds MUSCULOSKELETAL:no cyanosis of digits and no clubbing  NEURO: alert & oriented x 3 with fluent speech, no focal motor/sensory deficits EXTREMITIES: No lower extremity edema  LABORATORY DATA:  I have reviewed the data as listed   Chemistry      Component Value Date/Time   NA 143 10/10/2014 1251   K 4.0 10/10/2014 1251   CO2 31* 10/10/2014 1251   BUN 14.7 10/10/2014 1251   CREATININE 1.0 10/10/2014 1251   GLU 107* 06/24/2015 1119      Component Value Date/Time   CALCIUM 10.1 10/10/2014 1251   ALKPHOS 71 10/10/2014 1251   AST 18 10/10/2014 1251   ALT 9 10/10/2014 1251   BILITOT 0.43 10/10/2014 1251       Lab Results  Component Value Date   WBC 5.5 10/31/2014   HGB 13.2 10/31/2014   HCT 38.8 10/31/2014   MCV 86.0 10/31/2014   PLT 176 10/31/2014   NEUTROABS 4.7 10/10/2014   ASSESSMENT & PLAN:  Breast cancer of upper-outer quadrant of right female breast Right lumpectomy 11/01/2014: IDC grade 1, 1.8 cm, associated intermediate grade DCIS, LCIS, posterior margin less than 0.2 cm for IDC plus DCIS, 0/2 lymph nodes; ER 95%, PR 50%, HER-2 negative ratio 1.3, Ki-67 5%, T1 cN0 stage IA, Oncotype DX 23, 15% risk of recurrence, currently on adjuvant radiation therapy, started  anastrozole 1 mg daily 02/01/2015 stopped 03/04/2015,resumed February 2017 taking it every other day.  Anastrozole toxicities: 1. Mood changes and feelings of depression 2. Myalgias 3. Slowness to processing complex issues 4. Intermittent migraines Many of these symptoms have improved since she resumed in February 2017. She is currently taking every other day. I discussed with her that the symptoms might continue to improve over time.  Surveillance plan: 1. Annual mammograms 2. Clinical breast exams every 6 months  Return to clinic in 6 months for follow up   Orders Placed This Encounter  Procedures  . Glucose, whole blood (CBG)    Standing Status: Future      Number of Occurrences: 1     Standing Expiration Date: 06/23/2016   The patient has a good understanding of the overall plan. she agrees with it. she will call with any problems that may develop before the next visit here.   Rulon Eisenmenger, MD 06/24/2015

## 2015-06-24 NOTE — Progress Notes (Signed)
Unable to get in to exam room prior to MD.  No assessment performed.  

## 2015-12-24 ENCOUNTER — Telehealth: Payer: Self-pay | Admitting: Hematology and Oncology

## 2015-12-24 NOTE — Telephone Encounter (Signed)
12/25/2015 Appointment canceled per patient request. Patient plans to speak with nurse navigator and then call back to reschedule.

## 2015-12-25 ENCOUNTER — Ambulatory Visit: Payer: Managed Care, Other (non HMO) | Admitting: Hematology and Oncology

## 2015-12-25 ENCOUNTER — Other Ambulatory Visit: Payer: Self-pay | Admitting: *Deleted

## 2015-12-25 DIAGNOSIS — C50411 Malignant neoplasm of upper-outer quadrant of right female breast: Secondary | ICD-10-CM

## 2015-12-26 ENCOUNTER — Telehealth: Payer: Self-pay | Admitting: *Deleted

## 2015-12-26 NOTE — Telephone Encounter (Signed)
Attempted to call pt 3 times to discuss f/u with Dr. Lindi Adie after mammogram. Unable to leave msg. Will continue to call. Inbasket sent for appt to be r/s after mammogram on 9/18

## 2015-12-31 ENCOUNTER — Telehealth: Payer: Self-pay | Admitting: Hematology and Oncology

## 2015-12-31 NOTE — Telephone Encounter (Signed)
lvm to inform pt of 9/21 appt per LOS °

## 2016-01-07 ENCOUNTER — Telehealth: Payer: Self-pay | Admitting: Hematology and Oncology

## 2016-01-07 NOTE — Telephone Encounter (Signed)
01/09/2016 Appointment rescheduled to 10/23 per patient request.

## 2016-01-09 ENCOUNTER — Ambulatory Visit: Payer: Managed Care, Other (non HMO) | Admitting: Hematology and Oncology

## 2016-01-20 ENCOUNTER — Ambulatory Visit
Admission: RE | Admit: 2016-01-20 | Discharge: 2016-01-20 | Disposition: A | Discharge status: Home / Self Care | Payer: Managed Care, Other (non HMO) | Source: Ambulatory Visit | Attending: Hematology and Oncology | Admitting: Hematology and Oncology

## 2016-01-20 DIAGNOSIS — C50411 Malignant neoplasm of upper-outer quadrant of right female breast: Secondary | ICD-10-CM

## 2016-02-06 ENCOUNTER — Telehealth: Payer: Self-pay | Admitting: Hematology and Oncology

## 2016-02-06 NOTE — Telephone Encounter (Signed)
Returned call to patient to reschedule 10/23 appointment. Patient request appointment be rescheduled to a Monday in December.

## 2016-02-10 ENCOUNTER — Ambulatory Visit: Payer: Managed Care, Other (non HMO) | Admitting: Hematology and Oncology

## 2016-03-18 ENCOUNTER — Encounter (HOSPITAL_COMMUNITY): Payer: Self-pay

## 2016-03-18 ENCOUNTER — Telehealth: Payer: Self-pay | Admitting: Hematology and Oncology

## 2016-03-18 NOTE — Telephone Encounter (Signed)
Appointment rescheduled per patient request to January.

## 2016-03-23 ENCOUNTER — Ambulatory Visit: Payer: Managed Care, Other (non HMO) | Admitting: Hematology and Oncology

## 2016-04-06 IMAGING — US US BREAST BX W/ LOC DEV EA ADD LESION IMG BX SPEC US GUIDE*R*
1 series · 13 of 20 positions shown · non-contrast
Comparison: Previous exam(s).

ADDENDUM:
Pathology revealed grade I invasive ductal carcinoma, ductal
carcinoma in situ, and atypical lobular hyperplasia in the right
breast at [DATE] 5 cm from the nipple. The right breast at [DATE] 2 cm
from the nipple showed fibrocystic changes with usual ductal
hyperplasia. This was found to be concordant by Dr. Ermesom Dullius.
Pathology was discussed with the patient by telephone. She reported
doing well after the biopsies with tenderness at the sites. Post
biopsy instructions and care were reviewed and her questions were
answered. She asked that the reports be faxed to her primary care
physician, Dr. Anyanwu Atawodi, which was done. After speaking with
him, she called back to The [REDACTED] and
asked to be referred to [REDACTED] [REDACTED]. Her appointment is on October 10, 2014. My number was provided
for additional questions and concerns.

Pathology results reported by Yulu Loro RN, BSN on October 02, 2014.
CLINICAL DATA: Patient returns for ultrasound-guided core biopsy of
a a mass in the 10 o'clock location of the right breast 5 cm from
the nipple and a second mass in the 10 o'clock location of the right
breast 2 cm from the nipple.
EXAM:
ULTRASOUND GUIDED RIGHT BREAST CORE NEEDLE BIOPSY 2 SITES

[Series 1: us breast bx w/ loc dev ea add lesion img bx spec  · 0.06mm/px · 13 of 20 slices shown]
[im 1/20]
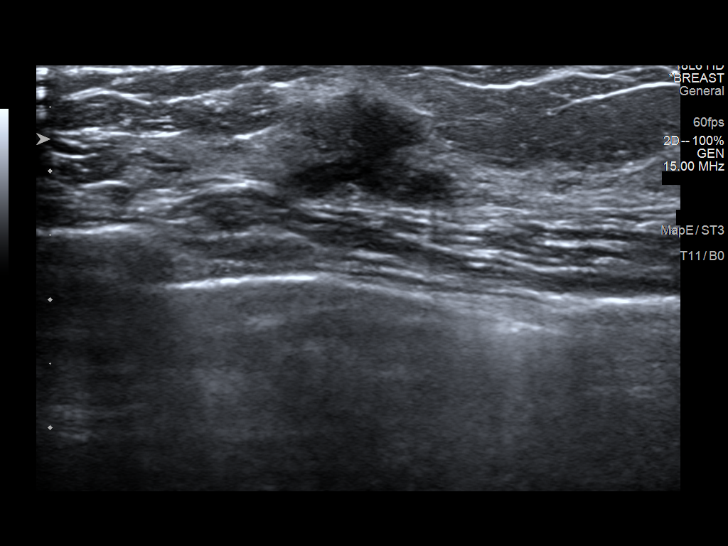
[im 3/20]
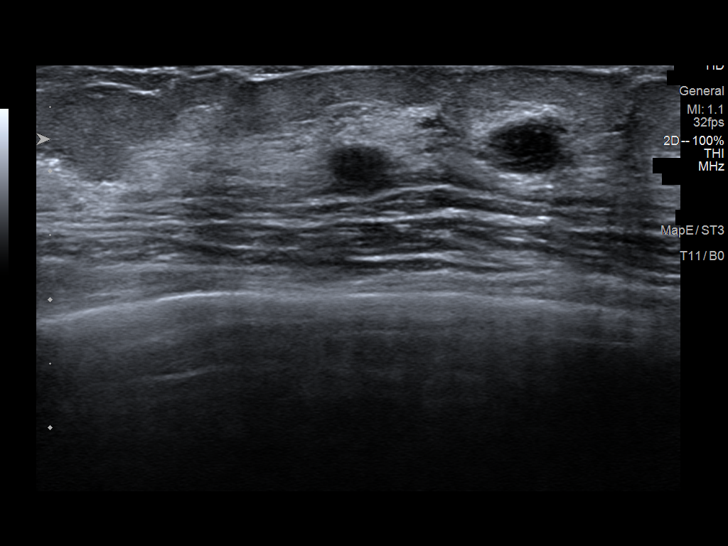
[im 4/20]
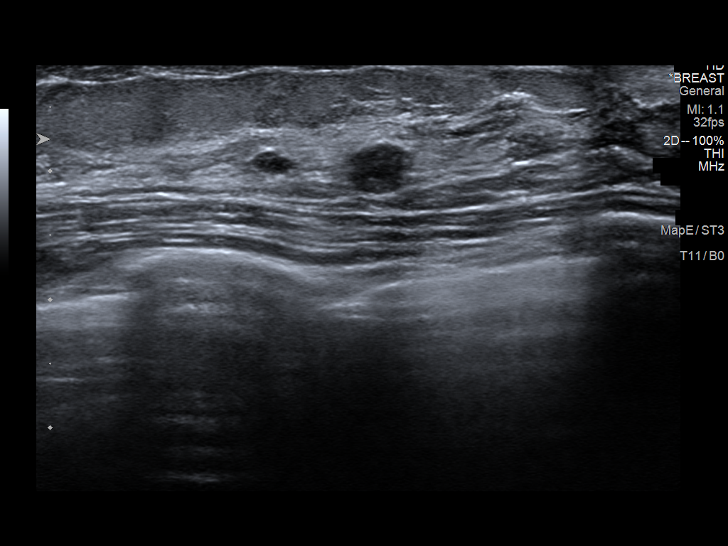
[im 6/20]
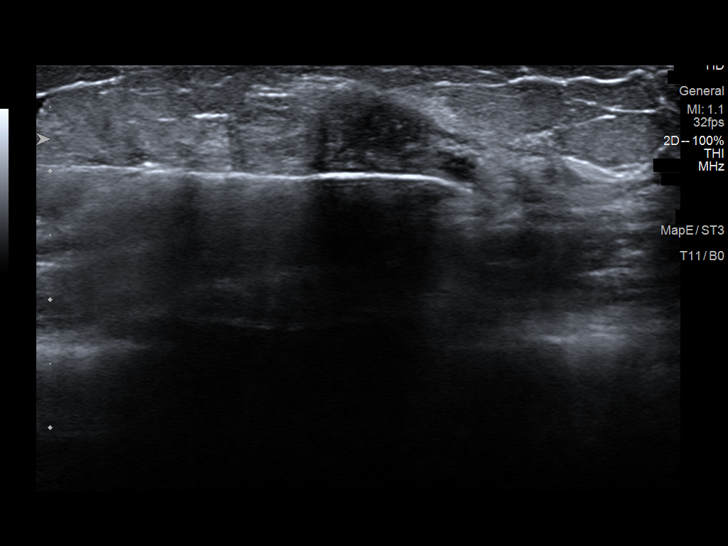
[im 7/20]
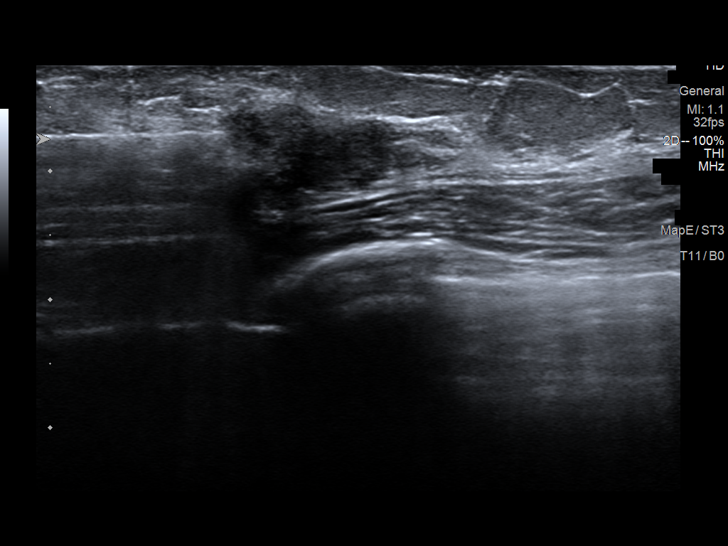
[im 9/20]
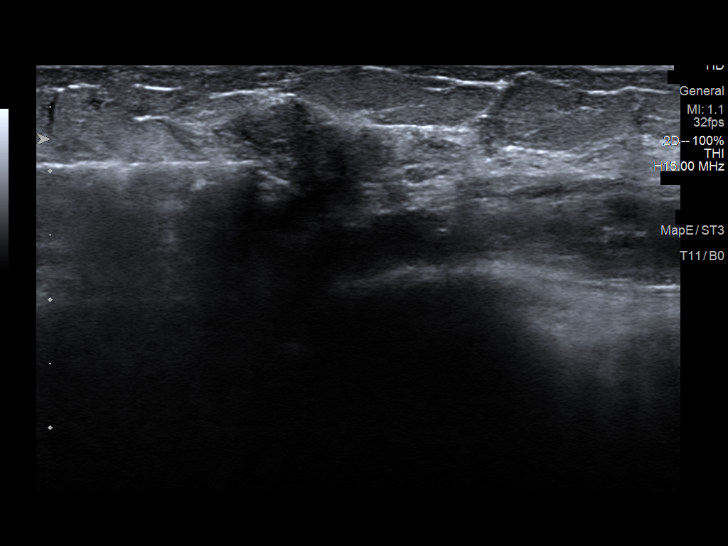
[im 11/20]
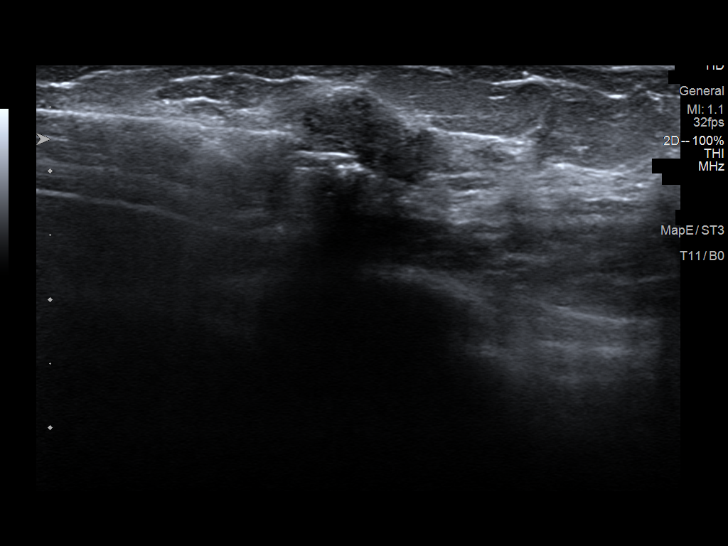
[im 12/20]
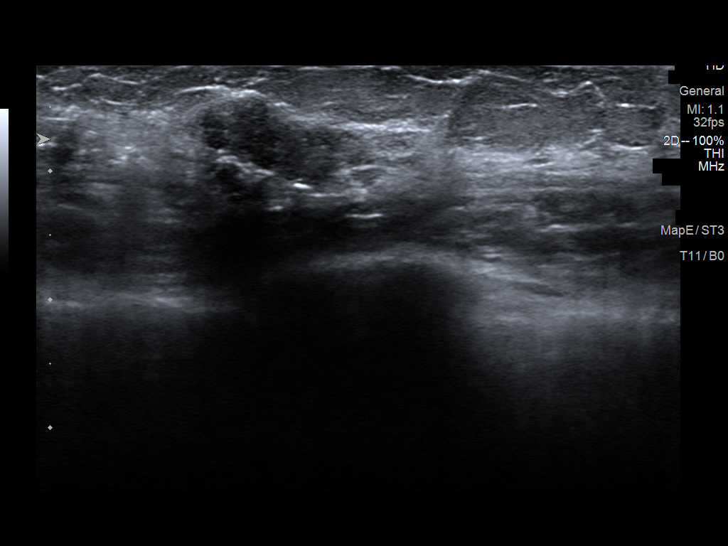
[im 14/20]
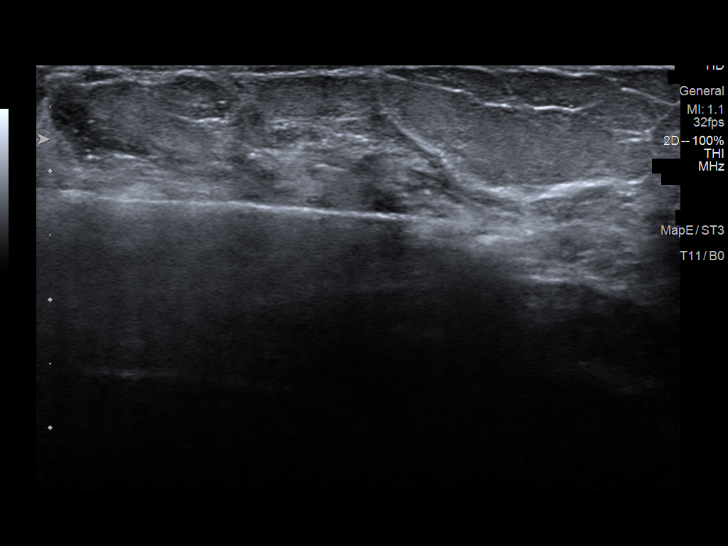
[im 15/20]
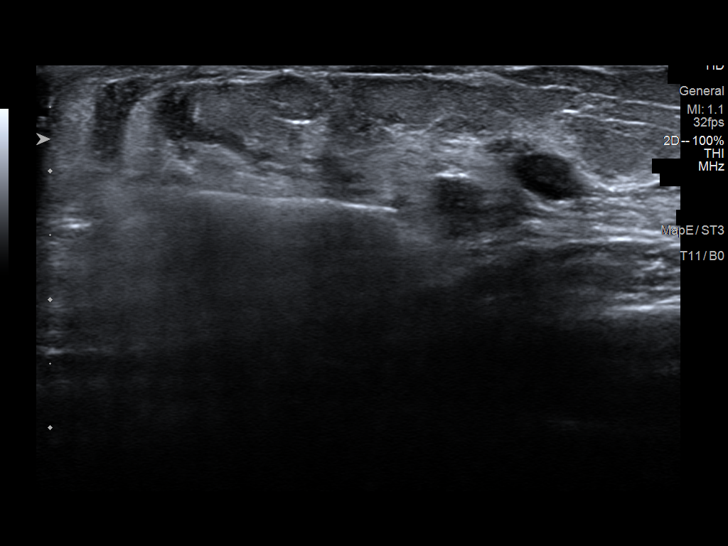
[im 17/20]
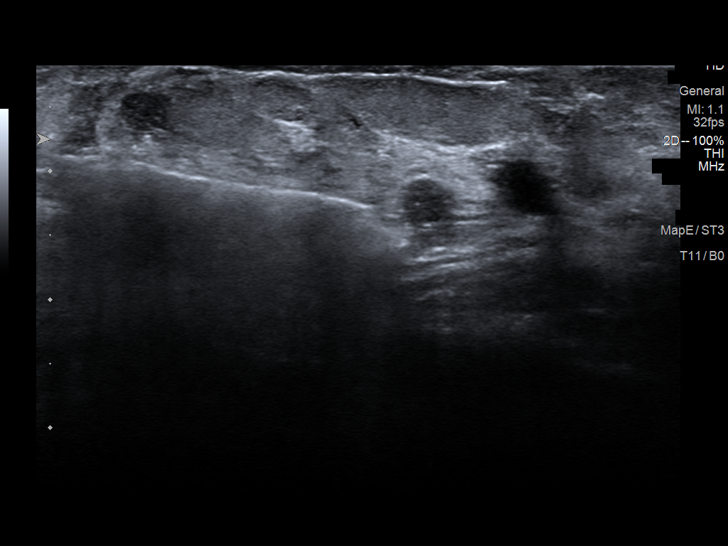
[im 18/20]
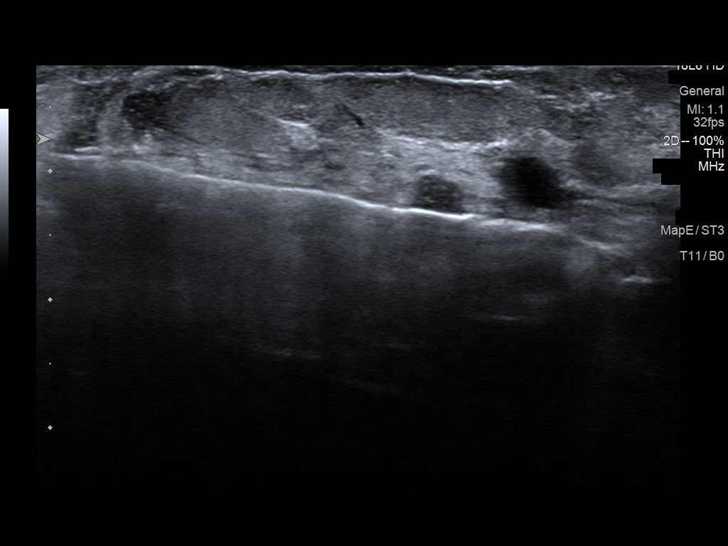
[im 20/20]
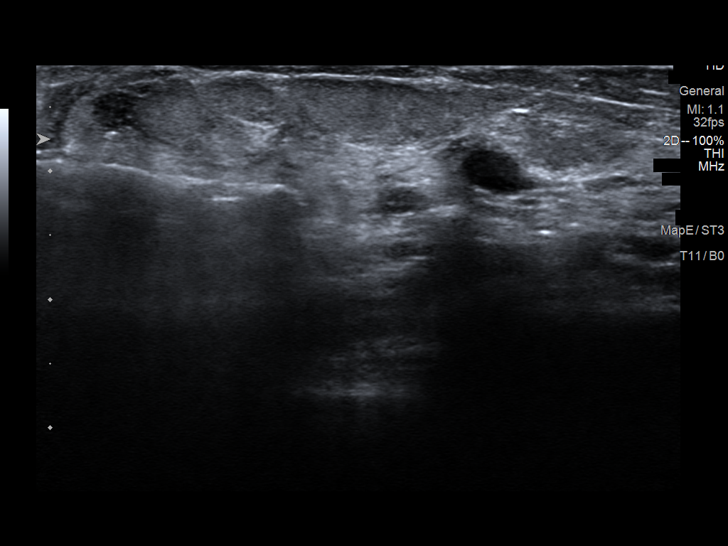

[13 of 20 positions shown; findings below may reference images not displayed]



Using sterile technique and 2% Lidocaine as local anesthetic, under
direct ultrasound visualization, a 14 gauge Dumel device was
used to perform biopsy of the more solid of 3 masses in the 10
o'clock location of the right breast 5 cm from the nipple using a
inferolateral approach. At the conclusion of procedure a ribbon
shaped tissue marker clip was deployed into the biopsy cavity.

Using the same technique mass in the 10 o'clock location of the
right breast 2 cm from the nipple was biopsied using an
inferolateral approach. At the conclusion of procedure a coil shaped
clip was deployed into the biopsy cavity.

Follow up 2 view mammogram was performed and dictated separately.
IMPRESSION: Ultrasound guided biopsy of right breast nodules. No apparent
complications.

## 2016-05-12 NOTE — Assessment & Plan Note (Signed)
Right lumpectomy 11/01/2014: IDC grade 1, 1.8 cm, associated intermediate grade DCIS, LCIS, posterior margin less than 0.2 cm for IDC plus DCIS, 0/2 lymph nodes; ER 95%, PR 50%, HER-2 negative ratio 1.3, Ki-67 5%, T1 cN0 stage IA, Oncotype DX 23, 15% risk of recurrence, currently on adjuvant radiation therapy, started anastrozole 1 mg daily 02/01/2015 stopped 03/04/2015,resumed February 2017 taking it every other day.  Anastrozole toxicities: 1. Mood changes and feelings of depression 2. Myalgias 3. Slowness to processing complex issues 4. Intermittent migraines Many of these symptoms have improved since she resumed in February 2017. She is currently taking every other day. I discussed with her that the symptoms might continue to improve over time.  Surveillance plan: 1. Annual mammograms: 01/20/16: Benign Density cat C 2. Clinical breast exams 05/13/16: No palpable concerns  Return to clinic in 1 yr for follow up

## 2016-05-13 ENCOUNTER — Other Ambulatory Visit: Payer: Self-pay

## 2016-05-13 ENCOUNTER — Ambulatory Visit (HOSPITAL_BASED_OUTPATIENT_CLINIC_OR_DEPARTMENT_OTHER): Payer: Managed Care, Other (non HMO) | Admitting: Hematology and Oncology

## 2016-05-13 ENCOUNTER — Ambulatory Visit: Payer: Managed Care, Other (non HMO)

## 2016-05-13 ENCOUNTER — Encounter (INDEPENDENT_AMBULATORY_CARE_PROVIDER_SITE_OTHER): Payer: Self-pay

## 2016-05-13 ENCOUNTER — Encounter: Payer: Self-pay | Admitting: Hematology and Oncology

## 2016-05-13 DIAGNOSIS — C50411 Malignant neoplasm of upper-outer quadrant of right female breast: Secondary | ICD-10-CM

## 2016-05-13 DIAGNOSIS — Z170 Estrogen receptor positive status [ER+]: Principal | ICD-10-CM

## 2016-05-13 NOTE — Progress Notes (Signed)
Patient Care Team: Arnetha Gula, MD as PCP - General (Family Medicine) Nicholas Lose, MD as Consulting Physician (Hematology and Oncology) Mauro Kaufmann, RN as Registered Nurse Rockwell Germany, RN as Registered Nurse Holley Bouche, NP as Nurse Practitioner (Nurse Practitioner) Meta Hatchet, LCSW as Social Worker Sylvan Cheese, NP as Nurse Practitioner (Hematology and Oncology) [name], MD as Consulting Physician (General Surgery) Eppie Gibson, MD as Attending Physician (Radiation Oncology)  DIAGNOSIS:  Encounter Diagnosis  Name Primary?  . 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%, Ki-67 5%, HER-2 negative, grade 1; right breast biopsy 10:00 2 cm from nipple: Fibrocystic changes with usual ductal hyperplasia      09/28/2014 Clinical Stage    Stage IA: T1c N0      11/01/2014 Definitive Surgery    Right lumpectomy: IDC grade 1, 1.8 cm, associated intermediate grade DCIS, LCIS, posterior margin less than 0.2 cm for IDC plus DCIS, 0/2 lymph nodes; ER 95%, PR 50%, HER-2 negative ratio 1.3, Ki-67 5%      11/01/2014 Pathologic Stage    Stage IA: pT1c pN0      11/01/2014 Oncotype testing    Oncotype 23, 16% ROR      11/08/2014 Procedure    Breast/Ovarian (GeneDx); reveals no clinically significant variant at ATM, BARD1, BRCA1, BRCA2, BRIP1, CDH1, CHEK2, FANCC, MLH1, MSH2, MSH6, NBN, PALB2, PMS2, PTEN, RAD51C, RAD51D, TP53, and XRCC2      12/11/2014 - 01/09/2015  Radiation Therapy    Adjuvant radiation therapy Isidore Moos): 1) Right breast / 40.05 Gy in 15 fractions 2) Right breast boost / 10 Gy in 5 fractions       02/01/2015 -  Anti-estrogen oral therapy    Anastrozole 1 mg daily stopped 03/04/2015 due to cloudiness of the head. Resumed of February 2017 every other day, stopped November 2017      03/21/2015 Survivorship    Survivorship care plan completed and copy provided to patient.       CHIEF COMPLIANT: Patient stopped anastrozole  INTERVAL HISTORY: Tracey Mckinney is a 56 year old with above-mentioned history of right lumpectomy followed by radiation and was on anastrozole. She could not tolerate the full dose and she was taking half a tablet daily. In spite of this she had diffuse musculoskeletal aches and pains and she stopped taking the medication in November 2017. Her symptoms had improved. She is here today to discuss what other alternatives may be available for her.  REVIEW OF SYSTEMS:   Constitutional: Denies fevers, chills or abnormal weight loss Eyes: Denies blurriness of vision Ears, nose, mouth, throat, and face: Denies mucositis or sore throat Respiratory: Denies cough, dyspnea or wheezes Cardiovascular: Denies palpitation, chest discomfort Gastrointestinal:  Denies nausea, heartburn or change in bowel habits Skin: Denies abnormal skin rashes Lymphatics: Denies new lymphadenopathy or easy bruising Neurological:Denies numbness, tingling or new weaknesses Behavioral/Psych: Mood is stable, no new changes  Extremities: No lower extremity edema Breast:  denies any pain or  lumps or nodules in either breasts All other systems were reviewed with the patient and are negative.  I have reviewed the past medical history, past surgical history, social history and family history with the patient and they are unchanged from previous note.  ALLERGIES:  is allergic to sulfa antibiotics; oxycodone; and tramadol.  MEDICATIONS:  Current  Outpatient Prescriptions  Medication Sig Dispense Refill  . anastrozole (ARIMIDEX) 1 MG tablet Take 1 tablet (1 mg total) by mouth daily. (Patient not taking: Reported on 03/21/2015) 90 tablet 3  . Cholecalciferol (VITAMIN D3) 5000 UNITS CAPS Take 1 capsule by mouth daily.     Marland Kitchen glucosamine-chondroitin 500-400 MG tablet Take 1 tablet by mouth daily.     . L-TYROSINE PO Take 250 mg elemental calcium/kg/hr by mouth daily.    Marland Kitchen levalbuterol (XOPENEX HFA) 45 MCG/ACT inhaler Inhale into the lungs every 4 (four) hours as needed for wheezing.    . Magnesium 500 MG CAPS Take 1 capsule by mouth daily.    . metroNIDAZOLE (METROCREAM) 0.75 % cream Apply 1 application topically daily as needed.     . Multiple Vitamins-Minerals (MULTIVITAMIN WITH MINERALS) tablet Take 1 tablet by mouth daily.    . non-metallic deodorant Jethro Poling) MISC Apply 1 application topically daily as needed.    . Probiotic Product (ACIDOPHILUS/GOAT MILK) CAPS Take 1 capsule by mouth daily.     . Thyroid (NATURE-THROID PO) Take 16 mg by mouth daily. 1/4 grain = 16.2 mg.     No current facility-administered medications for this visit.     PHYSICAL EXAMINATION: ECOG PERFORMANCE STATUS: 1 - Symptomatic but completely ambulatory  Vitals:   05/13/16 1444  BP: 117/62  Pulse: 77  Resp: 18  Temp: 98 F (36.7 C)   Filed Weights   05/13/16 1444  Weight: 139 lb 9.6 oz (63.3 kg)    GENERAL:alert, no distress and comfortable SKIN: skin color, texture, turgor are normal, no rashes or significant lesions EYES: normal, Conjunctiva are pink and non-injected, sclera clear OROPHARYNX:no exudate, no erythema and lips, buccal mucosa, and tongue normal  NECK: supple, thyroid normal size, non-tender, without nodularity LYMPH:  no palpable lymphadenopathy in the cervical, axillary or inguinal LUNGS: clear to auscultation and percussion with normal breathing effort HEART: regular rate & rhythm and no murmurs and no lower extremity  edema ABDOMEN:abdomen soft, non-tender and normal bowel sounds MUSCULOSKELETAL:no cyanosis of digits and no clubbing  NEURO: alert & oriented x 3 with fluent speech, no focal motor/sensory deficits EXTREMITIES: No lower extremity edema  LABORATORY DATA:  I have reviewed the data as listed   Chemistry      Component Value Date/Time   NA 143 10/10/2014 1251   K 4.0 10/10/2014 1251   CO2 31 (H) 10/10/2014 1251   BUN 14.7 10/10/2014 1251   CREATININE 1.0 10/10/2014 1251   GLU 107 (H) 06/24/2015 1119      Component Value Date/Time   CALCIUM 10.1 10/10/2014 1251   ALKPHOS 71 10/10/2014 1251   AST 18 10/10/2014 1251   ALT 9 10/10/2014 1251   BILITOT 0.43 10/10/2014 1251       Lab Results  Component Value Date   WBC 5.5 10/31/2014   HGB 13.2 10/31/2014   HCT 38.8 10/31/2014   MCV 86.0 10/31/2014   PLT 176 10/31/2014   NEUTROABS 4.7 10/10/2014    ASSESSMENT & PLAN:  Breast cancer of upper-outer quadrant of right female breast Right lumpectomy 11/01/2014: IDC grade 1, 1.8 cm, associated intermediate grade  DCIS, LCIS, posterior margin less than 0.2 cm for IDC plus DCIS, 0/2 lymph nodes; ER 95%, PR 50%, HER-2 negative ratio 1.3, Ki-67 5%, T1 cN0 stage IA, Oncotype DX 23, 15% risk of recurrence, currently on adjuvant radiation therapy, started anastrozole 1 mg daily 02/01/2015 stopped 03/04/2015,resumed February 2017 taking it every other day.Stopped November 2017  Anastrozole toxicities: Profound musculoskeletal aches and pains to the point that she was unable to get around. This is in spite of taking only half a tablet every day. She stopped taking anastrozole November 2018. I discussed with her about changing treatment to tamoxifen. She is extremely worried about side effects. I would like to send for CYP2D6 and I will call her with the result of this test and then determine the dose of tamoxifen.  Tamoxifen counseling:We discussed the risks and benefits of tamoxifen. These  include but not limited to insomnia, hot flashes, mood changes, vaginal dryness, and weight gain. Although rare, serious side effects including endometrial cancer, risk of blood clots were also discussed. We strongly believe that the benefits far outweigh the risks. Patient understands these risks and consented to starting treatment. Planned treatment duration is 4 years.   Surveillance plan: 1. Annual mammograms: 01/20/16: Benign Density cat C 2. Clinical breast exams 05/13/16: No palpable concerns  Return to clinic in 3 months for follow-up   I spent 25 minutes talking to the patient of which more than half was spent in counseling and coordination of care.  Orders Placed This Encounter  Procedures  . CYP2D6-Tamoxifen Efficacy   The patient has a good understanding of the overall plan. she agrees with it. she will call with any problems that may develop before the next visit here.   Rulon Eisenmenger, MD 05/13/16

## 2016-05-29 ENCOUNTER — Encounter (HOSPITAL_COMMUNITY): Payer: Self-pay

## 2016-06-04 ENCOUNTER — Telehealth: Payer: Self-pay | Admitting: Emergency Medicine

## 2016-06-04 NOTE — Telephone Encounter (Signed)
Patient called to follow up with lab results regarding testing for Tamoxifen metabolism. Advised patient that Dr Lindi Adie is out of the office today and will return on Monday 06/08/16. Patient also asking for a possible PT referral due to her left shoulder pain and inflammation due to anastrozole. Patient states limited ROM in left shoulder. Will discuss these concerns with Dr Lindi Adie on Monday 2/19 and notify patient of his further direction. Left message with patient stating that.

## 2016-06-08 ENCOUNTER — Other Ambulatory Visit: Payer: Self-pay | Admitting: Hematology and Oncology

## 2016-06-08 DIAGNOSIS — Z170 Estrogen receptor positive status [ER+]: Principal | ICD-10-CM

## 2016-06-08 DIAGNOSIS — C50411 Malignant neoplasm of upper-outer quadrant of right female breast: Secondary | ICD-10-CM

## 2016-06-08 MED ORDER — TAMOXIFEN CITRATE 20 MG PO TABS
20.0000 mg | ORAL_TABLET | Freq: Every day | ORAL | 3 refills | Status: DC
Start: 2016-06-08 — End: 2017-06-24

## 2016-06-09 LAB — CYP2D6-TAMOXIFEN EFFICACY

## 2016-07-27 ENCOUNTER — Ambulatory Visit: Payer: Managed Care, Other (non HMO) | Admitting: Physical Therapy

## 2016-08-11 ENCOUNTER — Ambulatory Visit: Payer: Managed Care, Other (non HMO) | Admitting: Hematology and Oncology

## 2017-04-28 ENCOUNTER — Other Ambulatory Visit: Payer: Self-pay | Admitting: Hematology and Oncology

## 2017-04-28 DIAGNOSIS — Z853 Personal history of malignant neoplasm of breast: Secondary | ICD-10-CM

## 2017-05-04 ENCOUNTER — Other Ambulatory Visit: Payer: Self-pay

## 2017-05-04 DIAGNOSIS — C50411 Malignant neoplasm of upper-outer quadrant of right female breast: Secondary | ICD-10-CM

## 2017-05-04 DIAGNOSIS — Z170 Estrogen receptor positive status [ER+]: Principal | ICD-10-CM

## 2017-05-10 ENCOUNTER — Other Ambulatory Visit: Payer: Self-pay | Admitting: Hematology and Oncology

## 2017-05-10 DIAGNOSIS — Z853 Personal history of malignant neoplasm of breast: Secondary | ICD-10-CM

## 2017-05-17 ENCOUNTER — Ambulatory Visit
Admission: RE | Admit: 2017-05-17 | Discharge: 2017-05-17 | Disposition: A | Discharge status: Home / Self Care | Payer: Managed Care, Other (non HMO) | Source: Ambulatory Visit | Attending: Hematology and Oncology | Admitting: Hematology and Oncology

## 2017-05-17 ENCOUNTER — Other Ambulatory Visit: Payer: Self-pay | Admitting: Hematology and Oncology

## 2017-05-17 DIAGNOSIS — N6320 Unspecified lump in the left breast, unspecified quadrant: Secondary | ICD-10-CM

## 2017-05-17 DIAGNOSIS — Z853 Personal history of malignant neoplasm of breast: Secondary | ICD-10-CM

## 2017-06-24 ENCOUNTER — Other Ambulatory Visit: Payer: Self-pay | Admitting: Hematology and Oncology

## 2017-11-15 ENCOUNTER — Ambulatory Visit
Admission: RE | Admit: 2017-11-15 | Discharge: 2017-11-15 | Disposition: A | Discharge status: Home / Self Care | Payer: Managed Care, Other (non HMO) | Source: Ambulatory Visit | Attending: Hematology and Oncology | Admitting: Hematology and Oncology

## 2017-11-15 ENCOUNTER — Other Ambulatory Visit: Payer: Self-pay | Admitting: Hematology and Oncology

## 2017-11-15 DIAGNOSIS — N6320 Unspecified lump in the left breast, unspecified quadrant: Secondary | ICD-10-CM

## 2018-05-23 ENCOUNTER — Other Ambulatory Visit: Payer: Managed Care, Other (non HMO)

## 2018-05-24 ENCOUNTER — Other Ambulatory Visit: Payer: Managed Care, Other (non HMO)

## 2018-05-30 ENCOUNTER — Telehealth: Payer: Self-pay | Admitting: *Deleted

## 2018-05-30 NOTE — Telephone Encounter (Signed)
D/t cost of diagnostic mammo at [facility], pt requesting imaging at [facility] Scheduled appt for 2/21 at 8:15am. Left detailed information for appt on vm.

## 2018-05-31 ENCOUNTER — Other Ambulatory Visit: Payer: Managed Care, Other (non HMO)

## 2018-06-08 ENCOUNTER — Emergency Department: Admission: EM | Admit: 2018-06-08 | Discharge: 2018-06-08 | Discharge status: Absent without leave | Payer: Self-pay

## 2018-06-30 ENCOUNTER — Encounter: Payer: Self-pay | Admitting: Hematology and Oncology

## 2018-07-21 ENCOUNTER — Telehealth: Payer: Self-pay | Admitting: Hematology and Oncology

## 2018-07-21 NOTE — Telephone Encounter (Signed)
Called patient regarding cancelled appointments, patient will call when ready to reschedule.  Message to provider

## 2018-08-02 ENCOUNTER — Ambulatory Visit: Payer: Managed Care, Other (non HMO) | Admitting: Hematology and Oncology

## 2018-10-06 ENCOUNTER — Telehealth: Payer: Self-pay | Admitting: *Deleted

## 2018-10-06 NOTE — Telephone Encounter (Signed)
REFERRAL SENT TO SCHEDULING AND NOTES ON FILE FROM [place] [phone] DR. Maryella Shivers JENNINGS

## 2018-10-24 ENCOUNTER — Other Ambulatory Visit: Payer: Self-pay

## 2018-10-24 ENCOUNTER — Encounter: Payer: Self-pay | Admitting: Internal Medicine

## 2018-10-24 ENCOUNTER — Ambulatory Visit (INDEPENDENT_AMBULATORY_CARE_PROVIDER_SITE_OTHER): Payer: Managed Care, Other (non HMO) | Admitting: Internal Medicine

## 2018-10-24 VITALS — BP 108/72 | HR 73 | Ht 66.0 in | Wt 135.0 lb

## 2018-10-24 DIAGNOSIS — R002 Palpitations: Secondary | ICD-10-CM | POA: Diagnosis not present

## 2018-10-24 NOTE — Progress Notes (Signed)
HPI Tracey Mckinney is referred today for evaluation of palpitations. She is a pleasant 58 yo woman with a h/o breast CA who had palpitations years ago. Work up at that time demonstrated no arrhythmias. She has had occaisional episodes of palpitations. On her bp cuff her HR has been around 100/min. She has had no documented arrhythmias. No syncope. She admits to some anxiousness. Allergies  Allergen Reactions  . Sulfa Antibiotics     Other reaction(s): Other Cannot remember hasn't taken in 20 years.  . Oxycodone Itching  . Tramadol Rash    Also had itching and numbness of face and hands     Current Outpatient Medications  Medication Sig Dispense Refill  . Cholecalciferol (VITAMIN D3) 5000 UNITS CAPS Take 1 capsule by mouth daily.     Marland Kitchen glucosamine-chondroitin 500-400 MG tablet Take 1 tablet by mouth daily.     . L-TYROSINE PO Take 250 mg elemental calcium/kg/hr by mouth daily.    . Magnesium 500 MG CAPS Take 1 capsule by mouth daily.    . Multiple Vitamins-Minerals (MULTIVITAMIN WITH MINERALS) tablet Take 1 tablet by mouth daily.    . non-metallic deodorant Jethro Poling) MISC Apply 1 application topically daily as needed.    . Probiotic Product (ACIDOPHILUS/GOAT MILK) CAPS Take 1 capsule by mouth daily.     . Thyroid (NATURE-THROID PO) Take 16 mg by mouth daily. 1/4 grain = 16.2 mg.     No current facility-administered medications for this visit.      Past Medical History:  Diagnosis Date  . ADD (attention deficit disorder)   . Arthritis   . Asthma    last flareup about a month ago--usually when it rains  . Breast cancer ([place]) 2016   IDC+DCIS+LCIS of UOQ of Right breast; ER/PR+, Her2-  . Chronic fatigue   . Depression   . Gallstones   . Hypothyroidism   . PONV (postoperative nausea and vomiting)   . Thyroid disease   . Wheat allergy    yeast & dairy    ROS:   All systems reviewed and negative except as noted in the HPI.   Past Surgical History:  Procedure Laterality  Date  . BREAST LUMPECTOMY Right 11/01/2014   radiation  . BREAST LUMPECTOMY WITH RADIOACTIVE SEED AND SENTINEL LYMPH NODE BIOPSY Right 11/01/2014   Procedure: BREAST LUMPECTOMY WITH RADIOACTIVE SEED AND SENTINEL LYMPH NODE BIOPSY;  Surgeon: [name], MD;  Location: [place];  Service: General;  Laterality: Right;  . BREAST SURGERY     right breast bx done in june 2-16  . CHOLECYSTECTOMY N/A 11/01/2014   Procedure: LAPAROSCOPIC CHOLECYSTECTOMY;  Surgeon: [name], MD;  Location: [place];  Service: General;  Laterality: N/A;  . EYE SURGERY    . EYE SURGERY Right    vitrious detachment and hemorhage  . REFRACTIVE SURGERY       Family History  Problem Relation Age of Onset  . Breast cancer Mother 6  . Breast cancer Maternal Aunt      Social History   Socioeconomic History  . Marital status: Married    Spouse name: Not on file  . Number of children: 2  . Years of education: Not on file  . Highest education level: Not on file  Occupational History  . Occupation: Recruitment consultant  Social Needs  . Financial resource strain: Not on file  . Food insecurity    Worry: Not on file    Inability: Not on file  .  Transportation needs    Medical: Not on file    Non-medical: Not on file  Tobacco Use  . Smoking status: Never Smoker  . Smokeless tobacco: Never Used  Substance and Sexual Activity  . Alcohol use: No  . Drug use: No  . Sexual activity: Not on file  Lifestyle  . Physical activity    Days per week: Not on file    Minutes per session: Not on file  . Stress: Not on file  Relationships  . Social Herbalist on phone: Not on file    Gets together: Not on file    Attends religious service: Not on file    Active member of club or organization: Not on file    Attends meetings of clubs or organizations: Not on file    Relationship status: Not on file  . Intimate partner violence    Fear of current or ex partner: Not on file    Emotionally abused: Not on file     Physically abused: Not on file    Forced sexual activity: Not on file  Other Topics Concern  . Not on file  Social History Narrative  . Not on file     BP 108/72   Pulse 73   Ht 5' 6"  (1.676 m)   Wt 135 lb (61.2 kg)   SpO2 98%   BMI 21.79 kg/m   Physical Exam:  Well appearing NAD HEENT: Unremarkable Neck:  No JVD, no thyromegally Lymphatics:  No adenopathy Back:  No CVA tenderness Lungs:  Clear with no wheezes HEART:  Regular rate rhythm, no murmurs, no rubs, no clicks Abd:  soft, positive bowel sounds, no organomegally, no rebound, no guarding Ext:  2 plus pulses, no edema, no cyanosis, no clubbing Skin:  No rashes no nodules Neuro:  CN II through XII intact, motor grossly intact  EKG - nsr  Assess/Plan: 1. Palpitations - the etiology of her symptoms is unclear. We discussed a 30 day monitor vs a Kardia mobile app for her Iphone vs an Apple watch vs watchful waiting. She will avoid caffeine. She will call us when she has decided how she would like to proceed.  Mikle Bosworth.D.

## 2018-10-24 NOTE — Patient Instructions (Addendum)
Medication Instructions:  Your physician recommends that you continue on your current medications as directed. Please refer to the Current Medication list given to you today.  Labwork: None ordered.  Testing/Procedures: None ordered.  Follow-Up: Your physician wants you to follow-up in: 4-5 months with Dr. Lovena Le.   You will receive a reminder letter in the mail two months in advance. If you don't receive a letter, please call our office to schedule the follow-up appointment.   Any Other Special Instructions Will Be Listed Below (If Applicable).  If you need a refill on your cardiac medications before your next appointment, please call your pharmacy.

## 2019-01-30 ENCOUNTER — Telehealth: Payer: Self-pay | Admitting: Hematology and Oncology

## 2019-01-30 NOTE — Telephone Encounter (Signed)
Returned patient's phone call regarding cancelling an appointment, per patient's request November appointment has been cancelled. Patient will call when ready to reschedule.

## 2019-02-28 ENCOUNTER — Ambulatory Visit: Payer: Managed Care, Other (non HMO) | Admitting: Hematology and Oncology

## 2019-06-01 ENCOUNTER — Telehealth: Payer: Self-pay | Admitting: Hematology and Oncology

## 2019-06-01 NOTE — Telephone Encounter (Signed)
Returned patient's phone call regarding scheduling an appointment, left a voicemail. 

## 2019-08-22 ENCOUNTER — Inpatient Hospital Stay: Payer: Managed Care, Other (non HMO) | Admitting: Hematology and Oncology

## 2019-08-29 NOTE — Progress Notes (Signed)
Patient Care Team: Arnetha Gula, MD as PCP - General (Family Medicine) Nicholas Lose, MD as Consulting Physician (Hematology and Oncology) Mauro Kaufmann, RN as Registered Nurse Rockwell Germany, RN as Registered Nurse Holley Bouche, NP (Inactive) as Nurse Practitioner (Nurse Practitioner) Meta Hatchet, LCSW (Inactive) as Social Worker Mackey, Johny Blamer, NP as Nurse Practitioner (Hematology and Oncology) [name], MD as Consulting Physician (General Surgery) Eppie Gibson, MD as Attending Physician (Radiation Oncology)  DIAGNOSIS:    ICD-10-CM   1. Malignant neoplasm of upper-outer quadrant of right breast in female, estrogen receptor positive ([place])  C50.411    Z17.0     SUMMARY OF ONCOLOGIC HISTORY: Oncology History  Breast cancer of upper-outer quadrant of right female breast ([place])  09/28/2014 Mammogram   Mammogram and ultrasound suspicious irregular mass at 10:00 position right breast 1.8 cm in addition 3 small nodules 0.5, 0.6, 0.5 cm biopsy proven to be benign fibrocystic changes   09/28/2014 Initial Biopsy   Right breast needle biopsy 10:00 position: 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%, PR 50%, Ki-67 5%, HER-2 negative, grade 1; right breast biopsy 10:00 2 cm from nipple: Fibrocystic changes with usual ductal hyperplasia   09/28/2014 Clinical Stage   Stage IA: T1c N0   11/01/2014 Definitive Surgery   Right lumpectomy: IDC grade 1, 1.8 cm, associated intermediate grade DCIS, LCIS, posterior margin less than 0.2 cm for IDC plus DCIS, 0/2 lymph nodes; ER 95%, PR 50%, HER-2 negative ratio 1.3, Ki-67 5%   11/01/2014 Pathologic Stage   Stage IA: pT1c pN0   11/01/2014 Oncotype testing   Oncotype 23, 16% ROR   11/08/2014 Procedure   Breast/Ovarian ([facility]); reveals no clinically significant variant at ATM, BARD1, BRCA1, BRCA2, BRIP1, CDH1, CHEK2, FANCC, MLH1, MSH2, MSH6, NBN, PALB2, PMS2, PTEN, RAD51C, RAD51D, TP53, and XRCC2   12/11/2014 -  01/09/2015 Radiation Therapy   Adjuvant radiation therapy Isidore Moos): 1) Right breast / 40.05 Gy in 15 fractions 2) Right breast boost / 10 Gy in 5 fractions    02/01/2015 -  Anti-estrogen oral therapy   Anastrozole 1 mg daily stopped 03/04/2015 due to cloudiness of the head. Resumed of February 2017 every other day, stopped November 2017   03/21/2015 Survivorship   Survivorship care plan completed and copy provided to patient.     CHIEF COMPLIANT: Follow-up of right breast cancer   INTERVAL HISTORY: Tracey Mckinney is a 59 y.o. with above-mentioned history of right breast cancer treated with right lumpectomy, radiation and was on antiestrogen therapy with anastrozole, which was discontinued due to myalgias. I last saw her over 3 years ago. Mammogram on 06/30/18 showed no evidence of malignancy bilaterally. She presents to the clinic today for follow-up.  She has mild discomfort in the right axilla.  Also mild discomfort in the epigastric region where she had gallbladder surgery.  ALLERGIES:  is allergic to sulfa antibiotics; oxycodone; and tramadol.  MEDICATIONS:  Current Outpatient Medications  Medication Sig Dispense Refill  . Cholecalciferol (VITAMIN D3) 5000 UNITS CAPS Take 1 capsule by mouth daily.     Marland Kitchen FENUGREEK PO Take 1 tablet by mouth daily.    Marland Kitchen glucosamine-chondroitin 500-400 MG tablet Take 1 tablet by mouth daily.     . L-TYROSINE PO Take 250 mg elemental calcium/kg/hr by mouth daily.    . Magnesium 500 MG CAPS Take 1 capsule by mouth daily.    . Misc Natural Products (ADRENAL PO) Take 1 tablet by mouth daily.    Marland Kitchen  Multiple Vitamins-Minerals (MULTIVITAMIN WITH MINERALS) tablet Take 1 tablet by mouth daily.    . non-metallic deodorant Jethro Poling) MISC Apply 1 application topically daily as needed.    . Probiotic Product (ACIDOPHILUS/GOAT MILK) CAPS Take 1 capsule by mouth daily.     Marland Kitchen QUERCETIN PO Take 1 tablet by mouth daily.    . Thyroid (NATURE-THROID PO) Take 16 mg by mouth daily.  1/4 grain = 16.2 mg.     No current facility-administered medications for this visit.    PHYSICAL EXAMINATION: ECOG PERFORMANCE STATUS: 1 - Symptomatic but completely ambulatory  Vitals:   08/30/19 1530  BP: 131/86  Pulse: 70  Resp: 18  Temp: 98 F (36.7 C)  SpO2: 99%   Filed Weights   08/30/19 1529 08/30/19 1530  Weight: 141 lb 3.2 oz (64 kg) 141 lb 3.2 oz (64 kg)    BREAST: No palpable masses or nodules in either right or left breasts. No palpable axillary supraclavicular or infraclavicular adenopathy no breast tenderness or nipple discharge. (exam performed in the presence of a chaperone)  LABORATORY DATA:  I have reviewed the data as listed CMP Latest Ref Rng & Units 10/10/2014  Glucose 70 - 140 mg/dl 107  BUN 7.0 - 26.0 mg/dL 14.7  Creatinine 0.6 - 1.1 mg/dL 1.0  Sodium 136 - 145 mEq/L 143  Potassium 3.5 - 5.1 mEq/L 4.0  CO2 22 - 29 mEq/L 31(H)  Calcium 8.4 - 10.4 mg/dL 10.1  Total Protein 6.4 - 8.3 g/dL 7.0  Total Bilirubin 0.20 - 1.20 mg/dL 0.43  Alkaline Phos 40 - 150 U/L 71  AST 5 - 34 U/L 18  ALT 0 - 55 U/L 9    Lab Results  Component Value Date   WBC 5.5 10/31/2014   HGB 13.2 10/31/2014   HCT 38.8 10/31/2014   MCV 86.0 10/31/2014   PLT 176 10/31/2014   NEUTROABS 4.7 10/10/2014    ASSESSMENT & PLAN:  Breast cancer of upper-outer quadrant of right female breast Right lumpectomy 11/01/2014: IDC grade 1, 1.8 cm, associated intermediate grade DCIS, LCIS, posterior margin less than 0.2 cm for IDC plus DCIS, 0/2 lymph nodes; ER 95%, PR 50%, HER-2 negative ratio 1.3, Ki-67 5%, T1 cN0 stage IA, Oncotype DX 23, 15% risk of recurrence, currently on adjuvant radiation therapy, started anastrozole 1 mg daily 02/01/2015 stopped 03/04/2015,resumed February 2017 taking it every other day.Stopped November 2017  Anastrozole toxicities: Profound musculoskeletal aches and pains to the point that she was unable to get around. This is in spite of taking only half a tablet  every day. She stopped taking anastrozole November 2018.  She tried tamoxifen briefly and discontinued it. CYP2D6: No variants detected She decided not to take any further antiestrogens.  Surveillance plan: 1. Annual mammograms: 06/29/2018 Benign Density cat C 2. Clinical breast exams  08/30/2019 no palpable concerns, mild discomfort in the right axilla.  Return to clinic on an as-needed basis.  Patient wishes to follow with the primary care physician for her breast exams and follow-ups.   No orders of the defined types were placed in this encounter.  The patient has a good understanding of the overall plan. she agrees with it. she will call with any problems that may develop before the next visit here.  Total time spent: 30 mins including face to face time and time spent for planning, charting and coordination of care  Nicholas Lose, MD 08/30/2019  I, Cloyde Reams Dorshimer, am acting as scribe for Dr. Nicholas Lose.  I have reviewed the above documentation for accuracy and completeness, and I agree with the above.

## 2019-08-30 ENCOUNTER — Inpatient Hospital Stay: Payer: Managed Care, Other (non HMO) | Attending: Hematology and Oncology | Admitting: Hematology and Oncology

## 2019-08-30 ENCOUNTER — Other Ambulatory Visit: Payer: Self-pay

## 2019-08-30 DIAGNOSIS — Z170 Estrogen receptor positive status [ER+]: Secondary | ICD-10-CM

## 2019-08-30 DIAGNOSIS — C50411 Malignant neoplasm of upper-outer quadrant of right female breast: Secondary | ICD-10-CM | POA: Insufficient documentation

## 2019-08-30 DIAGNOSIS — Z923 Personal history of irradiation: Secondary | ICD-10-CM | POA: Insufficient documentation

## 2019-08-30 DIAGNOSIS — Z79811 Long term (current) use of aromatase inhibitors: Secondary | ICD-10-CM | POA: Diagnosis not present

## 2019-08-30 NOTE — Assessment & Plan Note (Signed)
Right lumpectomy 11/01/2014: IDC grade 1, 1.8 cm, associated intermediate grade DCIS, LCIS, posterior margin less than 0.2 cm for IDC plus DCIS, 0/2 lymph nodes; ER 95%, PR 50%, HER-2 negative ratio 1.3, Ki-67 5%, T1 cN0 stage IA, Oncotype DX 23, 15% risk of recurrence, currently on adjuvant radiation therapy, started anastrozole 1 mg daily 02/01/2015 stopped 03/04/2015,resumed February 2017 taking it every other day.Stopped November 2017  Anastrozole toxicities: Profound musculoskeletal aches and pains to the point that she was unable to get around. This is in spite of taking only half a tablet every day. She stopped taking anastrozole November 2018. CYP2D6: No variants detected She decided not to take any further antiestrogens.  Surveillance plan: 1. Annual mammograms: 06/29/2018 Benign Density cat C 2. Clinical breast exams  08/30/2019 no palpable concerns  Return to clinic in 1 year for follow-up

## 2022-06-02 ENCOUNTER — Emergency Department (HOSPITAL_BASED_OUTPATIENT_CLINIC_OR_DEPARTMENT_OTHER): Payer: Managed Care, Other (non HMO)

## 2022-06-02 ENCOUNTER — Emergency Department (HOSPITAL_BASED_OUTPATIENT_CLINIC_OR_DEPARTMENT_OTHER)
Admission: EM | Admit: 2022-06-02 | Discharge: 2022-06-02 | Disposition: A | Discharge status: Home / Self Care | Payer: Managed Care, Other (non HMO) | Attending: Emergency Medicine | Admitting: Emergency Medicine

## 2022-06-02 ENCOUNTER — Other Ambulatory Visit: Payer: Self-pay

## 2022-06-02 DIAGNOSIS — R079 Chest pain, unspecified: Secondary | ICD-10-CM | POA: Diagnosis present

## 2022-06-02 DIAGNOSIS — R0789 Other chest pain: Secondary | ICD-10-CM | POA: Diagnosis not present

## 2022-06-02 DIAGNOSIS — J45909 Unspecified asthma, uncomplicated: Secondary | ICD-10-CM | POA: Insufficient documentation

## 2022-06-02 DIAGNOSIS — E039 Hypothyroidism, unspecified: Secondary | ICD-10-CM | POA: Insufficient documentation

## 2022-06-02 LAB — TROPONIN I (HIGH SENSITIVITY)
Troponin I (High Sensitivity): <2 ng/L (ref <18)
Troponin I (High Sensitivity): <2 ng/L (ref <18)

## 2022-06-02 LAB — BASIC METABOLIC PANEL
Anion gap: 9 (ref 5–15)
BUN: 19 mg/dL (ref 8–23)
CO2: 28 mmol/L (ref 22–32)
Calcium: 9.1 mg/dL (ref 8.9–10.3)
Chloride: 102 mmol/L (ref 98–111)
Creatinine, Ser: 0.77 mg/dL (ref 0.44–1.00)
GFR, Estimated: >60 mL/min (ref >60)
Glucose, Bld: 110 mg/dL — ABNORMAL HIGH (ref 70–99)
Potassium: 3.5 mmol/L (ref 3.5–5.1)
Sodium: 139 mmol/L (ref 135–145)

## 2022-06-02 LAB — CBC
HCT: 42.2 % (ref 36.0–46.0)
Hemoglobin: 13.9 g/dL (ref 12.0–15.0)
MCH: 28.7 pg (ref 26.0–34.0)
MCHC: 32.9 g/dL (ref 30.0–36.0)
MCV: 87.2 fL (ref 80.0–100.0)
Platelets: 217 10*3/uL (ref 150–400)
RBC: 4.84 MIL/uL (ref 3.87–5.11)
RDW: 12.5 % (ref 11.5–15.5)
WBC: 6.3 10*3/uL (ref 4.0–10.5)
nRBC: 0 % (ref 0.0–0.2)

## 2022-06-02 NOTE — ED Provider Notes (Signed)
[facility] Provider Note   CSN: [id number] Arrival date & time: 06/02/22  1510     History  Chief Complaint  Patient presents with   Chest Pain    Tracey Mckinney is a 62 y.o. female.  Patient with a complaint of left anterior chest pain with radiation to left arm.  Symptoms started yesterday.  Intermittent but today's been occurring more frequently.  No shortness of breath no nausea vomiting.  Patient states that about 8 years ago she was evaluated for chest pain.  Seems to relate to having echocardiogram seems not to have had a cardiac catheter or stress test.  Patient states the pain has been present all day will go away may be for about 5 or 10 minutes then reoccurs.  Past medical history significant for hypothyroidism.  History of asthma.  Patient is never used tobacco products.       Home Medications Prior to Admission medications   Medication Sig Start Date End Date Taking? Authorizing Provider  Cholecalciferol (VITAMIN D3) 5000 UNITS CAPS Take 1 capsule by mouth daily.     [provider]  FENUGREEK PO Take 1 tablet by mouth daily.    [provider]  glucosamine-chondroitin 500-400 MG tablet Take 1 tablet by mouth daily.     [provider]  L-TYROSINE PO Take 250 mg elemental calcium/kg/hr by mouth daily.    [provider]  Magnesium 500 MG CAPS Take 1 capsule by mouth daily.    [provider]  Misc Natural Products (ADRENAL PO) Take 1 tablet by mouth daily.    [provider]  Multiple Vitamins-Minerals (MULTIVITAMIN WITH MINERALS) tablet Take 1 tablet by mouth daily.    [provider]  non-metallic deodorant Jethro Poling) MISC Apply 1 application topically daily as needed.    [provider]  Probiotic Product (ACIDOPHILUS/GOAT MILK) CAPS Take 1 capsule by mouth daily.     [provider]  QUERCETIN PO Take 1 tablet by mouth daily.    [provider]  Thyroid (NATURE-THROID PO) Take 16 mg by mouth daily. 1/4 grain = 16.2 mg.    [provider]      Allergies    Sulfa antibiotics, Oxycodone, and Tramadol    Review of Systems   Review of Systems  Constitutional:  Negative for chills and fever.  HENT:  Negative for ear pain and sore throat.   Eyes:  Negative for pain and visual disturbance.  Respiratory:  Negative for cough and shortness of breath.   Cardiovascular:  Positive for chest pain. Negative for palpitations.  Gastrointestinal:  Negative for abdominal pain and vomiting.  Genitourinary:  Negative for dysuria and hematuria.  Musculoskeletal:  Negative for arthralgias and back pain.  Skin:  Negative for color change and rash.  Neurological:  Negative for seizures and syncope.  All other systems reviewed and are negative.   Physical Exam Updated Vital Signs BP 107/75   Pulse (!) 59   Temp 97.8 F (36.6 C) (Oral)   Resp 13   Ht 1.676 m (5' 6"$ )   Wt 69.9 kg   SpO2 98%   BMI 24.86 kg/m  Physical Exam Vitals and nursing note reviewed.  Constitutional:      General: She is not in acute distress.    Appearance: Normal appearance. She is well-developed.  HENT:     Head: Normocephalic and atraumatic.  Eyes:     Extraocular Movements: Extraocular movements  intact.     Conjunctiva/sclera: Conjunctivae normal.     Pupils: Pupils are equal, round, and reactive to light.  Cardiovascular:     Rate and Rhythm: Normal rate and regular rhythm.     Heart sounds: No murmur heard. Pulmonary:     Effort: Pulmonary effort is normal. No respiratory distress.     Breath sounds: Normal breath sounds.  Chest:     Chest wall: No tenderness.  Abdominal:     Palpations: Abdomen is soft.     Tenderness: There is no abdominal tenderness.  Musculoskeletal:        General: No swelling.     Cervical back: Normal range of motion and neck supple.     Right lower leg: No edema.     Left lower leg: No edema.   Skin:    General: Skin is warm and dry.     Capillary Refill: Capillary refill takes less than 2 seconds.  Neurological:     General: No focal deficit present.     Mental Status: She is alert and oriented to person, place, and time.     Cranial Nerves: No cranial nerve deficit.     Sensory: No sensory deficit.     Motor: No weakness.  Psychiatric:        Mood and Affect: Mood normal.     ED Results / Procedures / Treatments   Labs (all labs ordered are listed, but only abnormal results are displayed) Labs Reviewed  BASIC METABOLIC PANEL - Abnormal; Notable for the following components:      Result Value   Glucose, Bld 110 (*)    All other components within normal limits  CBC  TROPONIN I (HIGH SENSITIVITY)  TROPONIN I (HIGH SENSITIVITY)  TROPONIN I (HIGH SENSITIVITY)    EKG EKG Interpretation  Date/Time:  Tuesday June 02 2022 15:18:32 EST Ventricular Rate:  68 PR Interval:  124 QRS Duration: 102 QT Interval:  396 QTC Calculation: 422 R Axis:   65 Text Interpretation: Sinus rhythm Probable anteroseptal infarct, old No significant change since last tracing Confirmed by Fredia Sorrow [phone]) on 06/02/2022 10:10:02 PM  Radiology DG Chest 2 View  Result Date: 06/02/2022 CLINICAL DATA:  Chest pain since yesterday. EXAM: CHEST - 2 VIEW COMPARISON:  None Available. FINDINGS: The cardiac silhouette, mediastinal and hilar contours are normal. The lungs are clear. No pleural effusions. No pulmonary lesions. No pneumothorax. The bony thorax is intact. Surgical changes noted involving the right breast. IMPRESSION: No acute cardiopulmonary findings. Electronically Signed   By: Marijo Sanes M.D.   On: 06/02/2022 15:33    Procedures Procedures    Medications Ordered in ED Medications - No data to display  ED Course/ Medical Decision Making/ A&P                             Medical Decision Making Amount and/or Complexity of Data Reviewed Labs: ordered. Radiology:  ordered.   Patient's workup for the chest pain troponins x 2 without any acute findings.  Both less than 2 very reassuring basic metabolic panel normal CBC normal hemoglobin normal chest x-ray without any acute findings.  No concerns for pulmonary embolus oxygen saturations are 100% heart rate in the 60s.  EKG without any acute changes.  Patient will take baby aspirin a day will follow-up with cardiology.  Patient will return for any new or worse symptoms   Final Clinical Impression(s) / ED Diagnoses  Final diagnoses:  Atypical chest pain    Rx / DC Orders ED Discharge Orders     None         Fredia Sorrow, MD 06/02/22 [phone]

## 2022-06-02 NOTE — ED Triage Notes (Signed)
Pt arrives with c/o chest pain that started yesterday while she was on a walk. Per pt, chest pain subsided and then started back up this morning. Pt endorses fatigue and just "not feeling well." Pt denies SOB or n/v.

## 2022-06-02 NOTE — Discharge Instructions (Signed)
Make an appointment to follow-up with cardiology.  For additional workup.  Start taking a baby aspirin a day.  Today's workup without any acute findings.  Return for any new or worse symptoms.
